# Patient Record
Sex: Female | Born: 1945
Health system: Southern US, Community
[De-identification: ages and names within clinical notes are randomized; demographics above are authoritative.]

## PROBLEM LIST (undated history)

## (undated) DIAGNOSIS — F32A Depression, unspecified: Secondary | ICD-10-CM

## (undated) DIAGNOSIS — I10 Essential (primary) hypertension: Secondary | ICD-10-CM

## (undated) DIAGNOSIS — C50919 Malignant neoplasm of unspecified site of unspecified female breast: Secondary | ICD-10-CM

## (undated) DIAGNOSIS — K219 Gastro-esophageal reflux disease without esophagitis: Secondary | ICD-10-CM

## (undated) DIAGNOSIS — D649 Anemia, unspecified: Secondary | ICD-10-CM

## (undated) DIAGNOSIS — H9191 Unspecified hearing loss, right ear: Secondary | ICD-10-CM

## (undated) DIAGNOSIS — E785 Hyperlipidemia, unspecified: Secondary | ICD-10-CM

## (undated) DIAGNOSIS — Z8719 Personal history of other diseases of the digestive system: Secondary | ICD-10-CM

## (undated) DIAGNOSIS — C801 Malignant (primary) neoplasm, unspecified: Secondary | ICD-10-CM

## (undated) DIAGNOSIS — F419 Anxiety disorder, unspecified: Secondary | ICD-10-CM

## (undated) DIAGNOSIS — N3281 Overactive bladder: Secondary | ICD-10-CM

## (undated) DIAGNOSIS — R7611 Nonspecific reaction to tuberculin skin test without active tuberculosis: Secondary | ICD-10-CM

## (undated) DIAGNOSIS — F329 Major depressive disorder, single episode, unspecified: Secondary | ICD-10-CM

## (undated) DIAGNOSIS — E049 Nontoxic goiter, unspecified: Secondary | ICD-10-CM

## (undated) HISTORY — PX: GANGLION CYST EXCISION: SHX1691

## (undated) HISTORY — PX: REDUCTION MAMMAPLASTY: SUR839

## (undated) HISTORY — PX: DILATION AND CURETTAGE OF UTERUS: SHX78

## (undated) HISTORY — DX: Malignant (primary) neoplasm, unspecified: C80.1

## (undated) HISTORY — PX: BREAST SURGERY: SHX581

## (undated) HISTORY — DX: Hyperlipidemia, unspecified: E78.5

## (undated) HISTORY — PX: OTHER SURGICAL HISTORY: SHX169

## (undated) HISTORY — DX: Anemia, unspecified: D64.9

## (undated) HISTORY — PX: AUGMENTATION MAMMAPLASTY: SUR837

---
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
## 1975-09-25 HISTORY — PX: ADRENALECTOMY: SHX876

## 1999-09-25 DIAGNOSIS — C801 Malignant (primary) neoplasm, unspecified: Secondary | ICD-10-CM

## 1999-09-25 HISTORY — DX: Malignant (primary) neoplasm, unspecified: C80.1

## 1999-09-25 HISTORY — PX: BREAST LUMPECTOMY: SHX2

## 1999-09-25 HISTORY — PX: MASTECTOMY: SHX3

## 1999-09-25 HISTORY — PX: MASTECTOMY PARTIAL / LUMPECTOMY: SUR851

## 2002-09-24 HISTORY — PX: CARDIAC CATHETERIZATION: SHX172

## 2002-09-24 HISTORY — PX: OTHER SURGICAL HISTORY: SHX169

## 2004-09-24 HISTORY — PX: ABDOMINAL HYSTERECTOMY: SHX81

## 2005-09-24 HISTORY — PX: HEMANGIOMA EXCISION: SHX1734

## 2011-08-07 ENCOUNTER — Other Ambulatory Visit: Payer: Self-pay | Admitting: Internal Medicine

## 2011-08-07 DIAGNOSIS — Z1231 Encounter for screening mammogram for malignant neoplasm of breast: Secondary | ICD-10-CM

## 2011-08-15 ENCOUNTER — Emergency Department (HOSPITAL_COMMUNITY)
Admission: EM | Admit: 2011-08-15 | Discharge: 2011-08-15 | Disposition: A | Discharge status: Home / Self Care | Payer: Medicare Other | Attending: Emergency Medicine | Admitting: Emergency Medicine

## 2011-08-15 ENCOUNTER — Encounter: Payer: Self-pay | Admitting: Emergency Medicine

## 2011-08-15 DIAGNOSIS — H5789 Other specified disorders of eye and adnexa: Secondary | ICD-10-CM | POA: Insufficient documentation

## 2011-08-15 DIAGNOSIS — I10 Essential (primary) hypertension: Secondary | ICD-10-CM | POA: Insufficient documentation

## 2011-08-15 DIAGNOSIS — K219 Gastro-esophageal reflux disease without esophagitis: Secondary | ICD-10-CM | POA: Insufficient documentation

## 2011-08-15 DIAGNOSIS — T1590XA Foreign body on external eye, part unspecified, unspecified eye, initial encounter: Secondary | ICD-10-CM | POA: Insufficient documentation

## 2011-08-15 DIAGNOSIS — H1130 Conjunctival hemorrhage, unspecified eye: Secondary | ICD-10-CM | POA: Insufficient documentation

## 2011-08-15 DIAGNOSIS — Z79899 Other long term (current) drug therapy: Secondary | ICD-10-CM | POA: Insufficient documentation

## 2011-08-15 DIAGNOSIS — S058X9A Other injuries of unspecified eye and orbit, initial encounter: Secondary | ICD-10-CM | POA: Insufficient documentation

## 2011-08-15 DIAGNOSIS — H5710 Ocular pain, unspecified eye: Secondary | ICD-10-CM | POA: Insufficient documentation

## 2011-08-15 DIAGNOSIS — E119 Type 2 diabetes mellitus without complications: Secondary | ICD-10-CM | POA: Insufficient documentation

## 2011-08-15 DIAGNOSIS — S0502XA Injury of conjunctiva and corneal abrasion without foreign body, left eye, initial encounter: Secondary | ICD-10-CM

## 2011-08-15 HISTORY — DX: Gastro-esophageal reflux disease without esophagitis: K21.9

## 2011-08-15 HISTORY — DX: Overactive bladder: N32.81

## 2011-08-15 HISTORY — DX: Essential (primary) hypertension: I10

## 2011-08-15 MED ORDER — TETRACAINE HCL 0.5 % OP SOLN
2.0000 [drp] | Freq: Once | OPHTHALMIC | Status: AC
  Administered 2011-08-15: 2 [drp] via OPHTHALMIC
  Filled 2011-08-15: qty 2

## 2011-08-15 MED ORDER — MOXIFLOXACIN HCL 0.5 % OP SOLN: 1.0000 [drp] | Freq: Three times a day (TID) | OPHTHALMIC | Status: AC

## 2011-08-15 MED ORDER — FLUORESCEIN SODIUM 1 MG OP STRP
1.0000 | ORAL_STRIP | Freq: Once | OPHTHALMIC | Status: AC
  Administered 2011-08-15: 1 via OPHTHALMIC
  Filled 2011-08-15: qty 1

## 2011-08-15 MED ORDER — MOXIFLOXACIN HCL 0.5 % OP SOLN: 1.0000 [drp] | Freq: Three times a day (TID) | OPHTHALMIC | Status: DC

## 2011-08-15 NOTE — ED Notes (Signed)
Pt states that she was up a couple of hours ago reading and she rubbed her left eye, afterwards felt like something was in her eye.  Tried to look but didn't see anything but states that anytime that she blinks something scratches her eye.

## 2011-08-15 NOTE — ED Provider Notes (Signed)
History     CSN: [id number] Arrival date & time: 08/15/2011  7:33 AM   First MD Initiated Contact with Patient 08/15/11 [phone]      Chief Complaint  Patient presents with  . Foreign Body in Eye    (Consider location/radiation/quality/duration/timing/severity/associated sxs/prior treatment) Patient is a 65 y.o. female presenting with foreign body in eye. The history is provided by the patient.  Foreign Body in Eye This is a new problem. The current episode started today. The problem occurs intermittently. The problem has been unchanged. Pertinent negatives include no abdominal pain, chest pain, coughing, fever, headaches, nausea, vomiting or weakness. Exacerbated by: touching the eye or blinking. Treatments tried: eye drops to flush the eye. Improvement on treatment: incomplete relief, still sensation of foreign body.    Past Medical History  Diagnosis Date  . Diabetes mellitus   . Hypertension   . Acid reflux   . Overactive bladder     No past surgical history on file.  No family history on file.  History  Substance Use Topics  . Smoking status: Not on file  . Smokeless tobacco: Not on file  . Alcohol Use:     OB History    Grav Para Term Preterm Abortions TAB SAB Ect Mult Living                  Review of Systems  Constitutional: Negative for fever, activity change, appetite change and unexpected weight change.  HENT: Negative.   Eyes: Positive for pain and redness. Negative for photophobia, discharge, itching and visual disturbance.       Mild eye irritation in the L eye.  Respiratory: Negative for cough, choking, chest tightness and shortness of breath.   Cardiovascular: Negative for chest pain, palpitations and leg swelling.  Gastrointestinal: Negative for nausea, vomiting, abdominal pain, diarrhea, constipation and abdominal distention.  Musculoskeletal: Negative.   Skin: Negative.   Neurological: Negative for dizziness, seizures, speech difficulty, weakness  and headaches.  Hematological: Negative.   Psychiatric/Behavioral: Negative.   All other systems reviewed and are negative.    Allergies  Amoxapine and related and Sulfa antibiotics  Home Medications   Current Outpatient Rx  Name Route Sig Dispense Refill  . ATENOLOL PO Oral Take by mouth.      Marland Kitchen VITAMIN D3 1000 UNITS PO CAPS Oral Take by mouth.      Marland Kitchen VITAMIN B-12 PO Oral Take by mouth.      Marland Kitchen HYDROCHLOROTHIAZIDE PO Oral Take by mouth.      . METFORMIN HCL PO Oral Take by mouth.      . OXYBUTYNIN CHLORIDE PO Oral Take by mouth.      Marland Kitchen PANTOPRAZOLE SODIUM PO Oral Take by mouth.      . CRESTOR PO Oral Take by mouth.      Marland Kitchen JANUVIA PO Oral Take by mouth.        BP 125/88  Pulse 78  Temp(Src) 98.2 F (36.8 C) (Oral)  Resp 16  SpO2 95%  Physical Exam  Nursing note and vitals reviewed. Constitutional: She is oriented to person, place, and time. She appears well-developed and well-nourished.  HENT:  Head: Normocephalic and atraumatic.  Eyes: EOM are normal. Pupils are equal, round, and reactive to light. Right eye exhibits no chemosis, no discharge, no exudate and no hordeolum. No foreign body present in the right eye. Left eye exhibits no chemosis, no discharge, no exudate and no hordeolum. No foreign body present in the left eye.  Right conjunctiva is not injected. Right conjunctiva has no hemorrhage. Left conjunctiva has a hemorrhage. No scleral icterus.    Neck: Normal range of motion. Neck supple.  Cardiovascular: Normal rate, regular rhythm and normal heart sounds.   Pulmonary/Chest: Effort normal and breath sounds normal. No respiratory distress. She has no wheezes. She has no rales.  Abdominal: Soft. Bowel sounds are normal. She exhibits no distension. There is no tenderness. There is no rebound.  Musculoskeletal: Normal range of motion.  Neurological: She is alert and oriented to person, place, and time. No cranial nerve deficit.  Skin: Skin is warm and dry. She is not  diaphoretic.    ED Course  Procedures (including critical care time)  Labs Reviewed - No data to display No results found.   1. Corneal abrasion, left       MDM  Pt was scratching her eye and felt like she got something in it and tried to clear it with eye drops. States every time she blinks it feels as though it is scratching. Will evert eye and look at eyelids to examine. Will examine eye to ensure that there are no scratches. Uses contact however did not have them in at the time and wearing glasses now. Per pt, no change in her visual acuity and no new floaters or spots or loss of vision.  Will use pontocaine and fluorescein to examine eye.      8:11 AM Eyes examined and R eye normal, a couple small abrasions on the L cornea. No obvious foreign body noted and no foreign body on the eyelid. Will give antibiotics since pt is diabetic and will advise to not use contacts, will give number for eye doctor in case not resolved in 5-7 days. Given moxifloxacin drops for 1 week TID to the L eye.   Genella Mech, MD 08/15/11 [phone]

## 2011-08-15 NOTE — ED Provider Notes (Signed)
I saw and evaluated the patient, reviewed the resident's note and I agree with the findings and plan.  OS foreign body sensation.  Was rubbing eye but no trauma. Contacts not in.  No change in vision.  No pain with eye movement.  Anterior chamber clear.  2 punctate abrasions on mid cornea.   Glynn Octave, MD 08/15/11 [phone]

## 2011-08-31 ENCOUNTER — Ambulatory Visit
Admission: RE | Admit: 2011-08-31 | Discharge: 2011-08-31 | Disposition: A | Discharge status: Home / Self Care | Payer: Medicare Other | Source: Ambulatory Visit | Attending: Internal Medicine | Admitting: Internal Medicine

## 2011-08-31 DIAGNOSIS — Z1231 Encounter for screening mammogram for malignant neoplasm of breast: Secondary | ICD-10-CM

## 2011-09-11 ENCOUNTER — Other Ambulatory Visit: Payer: Self-pay | Admitting: Internal Medicine

## 2011-09-11 ENCOUNTER — Other Ambulatory Visit: Payer: Self-pay | Admitting: *Deleted

## 2011-09-11 DIAGNOSIS — Z124 Encounter for screening for malignant neoplasm of cervix: Secondary | ICD-10-CM | POA: Insufficient documentation

## 2011-09-14 ENCOUNTER — Other Ambulatory Visit: Payer: Self-pay | Admitting: Internal Medicine

## 2011-09-14 DIAGNOSIS — Z1231 Encounter for screening mammogram for malignant neoplasm of breast: Secondary | ICD-10-CM

## 2011-09-14 DIAGNOSIS — Z9011 Acquired absence of right breast and nipple: Secondary | ICD-10-CM

## 2011-10-02 ENCOUNTER — Ambulatory Visit
Admission: RE | Admit: 2011-10-02 | Discharge: 2011-10-02 | Disposition: A | Discharge status: Home / Self Care | Payer: Medicare Other | Source: Ambulatory Visit | Attending: Internal Medicine | Admitting: Internal Medicine

## 2011-10-02 DIAGNOSIS — Z1231 Encounter for screening mammogram for malignant neoplasm of breast: Secondary | ICD-10-CM | POA: Diagnosis not present

## 2011-10-02 DIAGNOSIS — Z853 Personal history of malignant neoplasm of breast: Secondary | ICD-10-CM | POA: Diagnosis not present

## 2011-10-02 DIAGNOSIS — Z9011 Acquired absence of right breast and nipple: Secondary | ICD-10-CM

## 2011-11-02 ENCOUNTER — Other Ambulatory Visit (HOSPITAL_COMMUNITY)
Admission: RE | Admit: 2011-11-02 | Discharge: 2011-11-02 | Disposition: A | Discharge status: Home / Self Care | Payer: Medicare Other | Source: Ambulatory Visit | Attending: Internal Medicine | Admitting: Internal Medicine

## 2011-11-27 DIAGNOSIS — M7120 Synovial cyst of popliteal space [Baker], unspecified knee: Secondary | ICD-10-CM | POA: Diagnosis not present

## 2012-01-09 DIAGNOSIS — E785 Hyperlipidemia, unspecified: Secondary | ICD-10-CM | POA: Diagnosis not present

## 2012-01-09 DIAGNOSIS — I10 Essential (primary) hypertension: Secondary | ICD-10-CM | POA: Diagnosis not present

## 2012-01-09 DIAGNOSIS — Z1331 Encounter for screening for depression: Secondary | ICD-10-CM | POA: Diagnosis not present

## 2012-01-09 DIAGNOSIS — K219 Gastro-esophageal reflux disease without esophagitis: Secondary | ICD-10-CM | POA: Diagnosis not present

## 2012-01-09 DIAGNOSIS — G4700 Insomnia, unspecified: Secondary | ICD-10-CM | POA: Diagnosis not present

## 2012-01-09 DIAGNOSIS — IMO0001 Reserved for inherently not codable concepts without codable children: Secondary | ICD-10-CM | POA: Diagnosis not present

## 2012-03-25 DIAGNOSIS — H524 Presbyopia: Secondary | ICD-10-CM | POA: Diagnosis not present

## 2012-03-25 DIAGNOSIS — H5200 Hypermetropia, unspecified eye: Secondary | ICD-10-CM | POA: Diagnosis not present

## 2012-03-25 DIAGNOSIS — E119 Type 2 diabetes mellitus without complications: Secondary | ICD-10-CM | POA: Diagnosis not present

## 2012-04-11 DIAGNOSIS — IMO0001 Reserved for inherently not codable concepts without codable children: Secondary | ICD-10-CM | POA: Diagnosis not present

## 2012-06-21 DIAGNOSIS — Z23 Encounter for immunization: Secondary | ICD-10-CM | POA: Diagnosis not present

## 2012-08-08 ENCOUNTER — Other Ambulatory Visit: Payer: Self-pay

## 2012-08-08 DIAGNOSIS — L659 Nonscarring hair loss, unspecified: Secondary | ICD-10-CM | POA: Diagnosis not present

## 2012-08-08 DIAGNOSIS — L820 Inflamed seborrheic keratosis: Secondary | ICD-10-CM | POA: Diagnosis not present

## 2012-08-08 DIAGNOSIS — D1801 Hemangioma of skin and subcutaneous tissue: Secondary | ICD-10-CM | POA: Diagnosis not present

## 2012-08-08 DIAGNOSIS — L905 Scar conditions and fibrosis of skin: Secondary | ICD-10-CM | POA: Diagnosis not present

## 2012-08-08 DIAGNOSIS — D485 Neoplasm of uncertain behavior of skin: Secondary | ICD-10-CM | POA: Diagnosis not present

## 2012-08-08 DIAGNOSIS — L821 Other seborrheic keratosis: Secondary | ICD-10-CM | POA: Diagnosis not present

## 2012-08-08 DIAGNOSIS — L819 Disorder of pigmentation, unspecified: Secondary | ICD-10-CM | POA: Diagnosis not present

## 2012-09-03 DIAGNOSIS — E1165 Type 2 diabetes mellitus with hyperglycemia: Secondary | ICD-10-CM | POA: Diagnosis not present

## 2012-09-03 DIAGNOSIS — E559 Vitamin D deficiency, unspecified: Secondary | ICD-10-CM | POA: Diagnosis not present

## 2012-09-03 DIAGNOSIS — L659 Nonscarring hair loss, unspecified: Secondary | ICD-10-CM | POA: Diagnosis not present

## 2012-09-03 DIAGNOSIS — R809 Proteinuria, unspecified: Secondary | ICD-10-CM | POA: Diagnosis not present

## 2012-09-03 DIAGNOSIS — E1129 Type 2 diabetes mellitus with other diabetic kidney complication: Secondary | ICD-10-CM | POA: Diagnosis not present

## 2012-09-03 DIAGNOSIS — Z0000 Encounter for general adult medical examination without abnormal findings: Secondary | ICD-10-CM | POA: Diagnosis not present

## 2012-09-03 DIAGNOSIS — I10 Essential (primary) hypertension: Secondary | ICD-10-CM | POA: Diagnosis not present

## 2012-09-03 DIAGNOSIS — Z1331 Encounter for screening for depression: Secondary | ICD-10-CM | POA: Diagnosis not present

## 2012-09-03 DIAGNOSIS — K219 Gastro-esophageal reflux disease without esophagitis: Secondary | ICD-10-CM | POA: Diagnosis not present

## 2012-10-06 ENCOUNTER — Other Ambulatory Visit: Payer: Self-pay | Admitting: Internal Medicine

## 2012-10-06 DIAGNOSIS — Z853 Personal history of malignant neoplasm of breast: Secondary | ICD-10-CM

## 2012-10-06 DIAGNOSIS — Z1231 Encounter for screening mammogram for malignant neoplasm of breast: Secondary | ICD-10-CM

## 2012-10-09 ENCOUNTER — Other Ambulatory Visit: Payer: Self-pay | Admitting: Obstetrics and Gynecology

## 2012-10-09 ENCOUNTER — Other Ambulatory Visit (HOSPITAL_COMMUNITY)
Admission: RE | Admit: 2012-10-09 | Discharge: 2012-10-09 | Disposition: A | Discharge status: Home / Self Care | Payer: Medicare Other | Source: Ambulatory Visit | Attending: Obstetrics and Gynecology | Admitting: Obstetrics and Gynecology

## 2012-10-09 DIAGNOSIS — B977 Papillomavirus as the cause of diseases classified elsewhere: Secondary | ICD-10-CM | POA: Diagnosis not present

## 2012-10-09 DIAGNOSIS — Z01419 Encounter for gynecological examination (general) (routine) without abnormal findings: Secondary | ICD-10-CM | POA: Insufficient documentation

## 2012-10-09 DIAGNOSIS — N952 Postmenopausal atrophic vaginitis: Secondary | ICD-10-CM | POA: Diagnosis not present

## 2012-10-09 DIAGNOSIS — Z1151 Encounter for screening for human papillomavirus (HPV): Secondary | ICD-10-CM | POA: Diagnosis not present

## 2012-10-09 DIAGNOSIS — N63 Unspecified lump in unspecified breast: Secondary | ICD-10-CM | POA: Diagnosis not present

## 2012-10-10 ENCOUNTER — Other Ambulatory Visit: Payer: Self-pay | Admitting: Internal Medicine

## 2012-10-10 DIAGNOSIS — N6320 Unspecified lump in the left breast, unspecified quadrant: Secondary | ICD-10-CM

## 2012-10-20 ENCOUNTER — Ambulatory Visit
Admission: RE | Admit: 2012-10-20 | Discharge: 2012-10-20 | Disposition: A | Discharge status: Home / Self Care | Payer: Medicare Other | Source: Ambulatory Visit | Attending: Internal Medicine | Admitting: Internal Medicine

## 2012-10-20 DIAGNOSIS — N6320 Unspecified lump in the left breast, unspecified quadrant: Secondary | ICD-10-CM

## 2012-10-20 DIAGNOSIS — Z853 Personal history of malignant neoplasm of breast: Secondary | ICD-10-CM | POA: Diagnosis not present

## 2012-10-29 ENCOUNTER — Ambulatory Visit: Payer: Medicare Other

## 2012-11-19 DIAGNOSIS — IMO0001 Reserved for inherently not codable concepts without codable children: Secondary | ICD-10-CM | POA: Diagnosis not present

## 2012-12-12 DIAGNOSIS — K219 Gastro-esophageal reflux disease without esophagitis: Secondary | ICD-10-CM | POA: Diagnosis not present

## 2012-12-12 DIAGNOSIS — E119 Type 2 diabetes mellitus without complications: Secondary | ICD-10-CM | POA: Diagnosis not present

## 2012-12-12 DIAGNOSIS — D509 Iron deficiency anemia, unspecified: Secondary | ICD-10-CM | POA: Diagnosis not present

## 2012-12-12 DIAGNOSIS — I10 Essential (primary) hypertension: Secondary | ICD-10-CM | POA: Diagnosis not present

## 2012-12-24 DIAGNOSIS — IMO0001 Reserved for inherently not codable concepts without codable children: Secondary | ICD-10-CM | POA: Diagnosis not present

## 2013-01-28 DIAGNOSIS — E1129 Type 2 diabetes mellitus with other diabetic kidney complication: Secondary | ICD-10-CM | POA: Diagnosis not present

## 2013-02-12 DIAGNOSIS — L820 Inflamed seborrheic keratosis: Secondary | ICD-10-CM | POA: Diagnosis not present

## 2013-02-12 DIAGNOSIS — L821 Other seborrheic keratosis: Secondary | ICD-10-CM | POA: Diagnosis not present

## 2013-03-19 DIAGNOSIS — IMO0001 Reserved for inherently not codable concepts without codable children: Secondary | ICD-10-CM | POA: Diagnosis not present

## 2013-03-19 DIAGNOSIS — I10 Essential (primary) hypertension: Secondary | ICD-10-CM | POA: Diagnosis not present

## 2013-03-30 DIAGNOSIS — E119 Type 2 diabetes mellitus without complications: Secondary | ICD-10-CM | POA: Diagnosis not present

## 2013-04-13 ENCOUNTER — Other Ambulatory Visit: Payer: Self-pay | Admitting: Internal Medicine

## 2013-04-13 DIAGNOSIS — R221 Localized swelling, mass and lump, neck: Secondary | ICD-10-CM

## 2013-04-13 DIAGNOSIS — R220 Localized swelling, mass and lump, head: Secondary | ICD-10-CM | POA: Diagnosis not present

## 2013-04-14 ENCOUNTER — Other Ambulatory Visit: Payer: Self-pay | Admitting: Internal Medicine

## 2013-04-14 ENCOUNTER — Ambulatory Visit
Admission: RE | Admit: 2013-04-14 | Discharge: 2013-04-14 | Disposition: A | Discharge status: Home / Self Care | Payer: Medicare Other | Source: Ambulatory Visit | Attending: Internal Medicine | Admitting: Internal Medicine

## 2013-04-14 DIAGNOSIS — R599 Enlarged lymph nodes, unspecified: Secondary | ICD-10-CM | POA: Diagnosis not present

## 2013-04-14 DIAGNOSIS — E041 Nontoxic single thyroid nodule: Secondary | ICD-10-CM

## 2013-04-14 DIAGNOSIS — R221 Localized swelling, mass and lump, neck: Secondary | ICD-10-CM

## 2013-04-14 DIAGNOSIS — R9389 Abnormal findings on diagnostic imaging of other specified body structures: Secondary | ICD-10-CM

## 2013-04-14 MED ORDER — IOHEXOL 300 MG/ML  SOLN
75.0000 mL | Freq: Once | INTRAMUSCULAR | Status: AC | PRN
  Administered 2013-04-14: 75 mL via INTRAVENOUS

## 2013-04-16 ENCOUNTER — Ambulatory Visit
Admission: RE | Admit: 2013-04-16 | Discharge: 2013-04-16 | Disposition: A | Discharge status: Home / Self Care | Payer: Medicare Other | Source: Ambulatory Visit | Attending: Internal Medicine | Admitting: Internal Medicine

## 2013-04-16 ENCOUNTER — Other Ambulatory Visit (HOSPITAL_COMMUNITY)
Admission: RE | Admit: 2013-04-16 | Discharge: 2013-04-16 | Disposition: A | Discharge status: Home / Self Care | Payer: Medicare Other | Source: Ambulatory Visit | Attending: Internal Medicine | Admitting: Internal Medicine

## 2013-04-16 DIAGNOSIS — E042 Nontoxic multinodular goiter: Secondary | ICD-10-CM | POA: Diagnosis not present

## 2013-04-16 DIAGNOSIS — E041 Nontoxic single thyroid nodule: Secondary | ICD-10-CM | POA: Diagnosis not present

## 2013-04-16 DIAGNOSIS — E049 Nontoxic goiter, unspecified: Secondary | ICD-10-CM | POA: Insufficient documentation

## 2013-04-16 DIAGNOSIS — R9389 Abnormal findings on diagnostic imaging of other specified body structures: Secondary | ICD-10-CM

## 2013-04-28 DIAGNOSIS — D1801 Hemangioma of skin and subcutaneous tissue: Secondary | ICD-10-CM | POA: Diagnosis not present

## 2013-04-28 DIAGNOSIS — D2330 Other benign neoplasm of skin of unspecified part of face: Secondary | ICD-10-CM | POA: Diagnosis not present

## 2013-04-28 DIAGNOSIS — L659 Nonscarring hair loss, unspecified: Secondary | ICD-10-CM | POA: Diagnosis not present

## 2013-04-28 DIAGNOSIS — L739 Follicular disorder, unspecified: Secondary | ICD-10-CM | POA: Diagnosis not present

## 2013-04-28 DIAGNOSIS — L819 Disorder of pigmentation, unspecified: Secondary | ICD-10-CM | POA: Diagnosis not present

## 2013-04-28 DIAGNOSIS — L820 Inflamed seborrheic keratosis: Secondary | ICD-10-CM | POA: Diagnosis not present

## 2013-04-28 DIAGNOSIS — L821 Other seborrheic keratosis: Secondary | ICD-10-CM | POA: Diagnosis not present

## 2013-05-18 ENCOUNTER — Encounter (INDEPENDENT_AMBULATORY_CARE_PROVIDER_SITE_OTHER): Payer: Self-pay | Admitting: Surgery

## 2013-05-18 ENCOUNTER — Ambulatory Visit (INDEPENDENT_AMBULATORY_CARE_PROVIDER_SITE_OTHER): Payer: Medicare Other | Admitting: Surgery

## 2013-05-18 VITALS — BP 148/82 | HR 74 | Resp 18 | Ht 64.5 in | Wt 196.6 lb

## 2013-05-18 DIAGNOSIS — Q892 Congenital malformations of other endocrine glands: Secondary | ICD-10-CM

## 2013-05-18 DIAGNOSIS — E042 Nontoxic multinodular goiter: Secondary | ICD-10-CM | POA: Diagnosis not present

## 2013-05-18 NOTE — Progress Notes (Signed)
General Surgery [facility]  Chief Complaint  Patient presents with  . New Evaluation    thyroid nodules, thyroglossal duct cyst - referral from Dr. Kirby Funk    HISTORY: Patient is a 67 year old female referred by her primary care physician for evaluation of a neck mass. Dr. Kirby Funk has already performed a thorough workup including CT scan of the neck, thyroid ultrasound, and ultrasound-guided fine-needle aspiration biopsy of a dominant thyroid nodule. After review of these studies, it appears that the patient has 2 separate problems, being a thyroglossal duct cyst and a multinodular thyroid goiter.  Patient first noted a mass in the anterior neck approximately 8 months ago. This was brought to her attention by her dentist. Patient has noted mild increase in size of the centrally located neck mass. She brought this to the attention of her primary care physician who ordered the above studies.  CT scan shows a probable thyroglossal duct cyst measuring approximately 2 cm in greatest dimension. It appears multi-septated. It is slightly thick walled consistent with inflammatory changes.  Thyroid ultrasound was performed and shows a slightly enlarged thyroid containing multiple bilateral thyroid nodules. The dominant nodule is in the left inferior pole measuring 2.9 cm. Ultrasound-guided fine-needle aspiration biopsy was performed and shows benign follicular cells consistent with a follicular adenoma.  Patient was on Synthroid for a brief time in her early 43s for irregular menstrual cycles. Patient has also undergone a right adrenalectomy for a benign tumor. There is a family history of thyroid disease with nodules in the patient's daughter. There is no family history of thyroid malignancy.  Past Medical History  Diagnosis Date  . Diabetes mellitus   . Hypertension   . Acid reflux   . Overactive bladder   . Anemia   . Hyperlipidemia   . Cancer     Current  Outpatient Prescriptions  Medication Sig Dispense Refill  . atenolol (TENORMIN) 25 MG tablet Take 25 mg by mouth daily.        . calcium-vitamin D (OSCAL WITH D) 500-200 MG-UNIT per tablet Take 1 tablet by mouth.      . Cyanocobalamin (VITAMIN B-12 PO) Take 1 tablet by mouth daily. otc      . glimepiride (AMARYL) 4 MG tablet       . hydrochlorothiazide (HYDRODIURIL) 25 MG tablet Take 25 mg by mouth daily.        Marland Kitchen ibuprofen (ADVIL,MOTRIN) 200 MG tablet Take 200 mg by mouth every 6 (six) hours as needed for pain.      . metFORMIN (GLUCOPHAGE) 500 MG tablet Take 1,000 mg by mouth 2 (two) times daily with a meal.        . Multiple Vitamins-Minerals (MULTIVITAMIN WITH MINERALS) tablet Take 1 tablet by mouth daily.      Marland Kitchen oxybutynin (DITROPAN XL) 15 MG 24 hr tablet Take 15 mg by mouth daily.        . pantoprazole (PROTONIX) 40 MG tablet       . potassium chloride (MICRO-K) 10 MEQ CR capsule       . rosuvastatin (CRESTOR) 10 MG tablet Take 10 mg by mouth daily.        Marland Kitchen VICTOZA 18 MG/3ML SOPN        No current facility-administered medications for this visit.    Allergies  Allergen Reactions  . Morphine And Related Shortness Of Breath  . Amoxapine And Related     amoxicillin  . Demerol [Meperidine] Nausea Only  .  Sulfa Antibiotics     Family History  Problem Relation Age of Onset  . Cancer Maternal Grandmother     breast    History   Social History  . Marital Status: Divorced    Spouse Name: N/A    Number of Children: N/A  . Years of Education: N/A   Social History Main Topics  . Smoking status: Never Smoker   . Smokeless tobacco: Never Used  . Alcohol Use: Yes  . Drug Use: No  . Sexual Activity: None   Other Topics Concern  . None   Social History Narrative  . None    REVIEW OF SYSTEMS - PERTINENT POSITIVES ONLY: Denies tremor. Denies palpitation. Denies compressive symptoms. Mild discomfort at site of thyroglossal duct cyst.  EXAM: Filed Vitals:   05/18/13  1105  BP: 148/82  Pulse: 74  Resp: 18    HEENT: normocephalic; pupils equal and reactive; sclerae clear; dentition good; mucous membranes moist NECK:  Palpable nodule in upper midline of the neck, approximately 2-2-1/2 cm in size, mobile, mildly tender; thyroid gland is mildly nodular and slightly firm in the left thyroid lobe, palpable nodule in inferior left lobe extending beneath the head of the clavicle; symmetric on extension; no palpable anterior or posterior cervical lymphadenopathy; no supraclavicular masses; no tenderness CHEST: clear to auscultation bilaterally without rales, rhonchi, or wheezes CARDIAC: regular rate and rhythm without significant murmur; peripheral pulses are full EXT:  non-tender without edema; no deformity NEURO: no gross focal deficits; no sign of tremor   LABORATORY RESULTS: See [facility] (CHL-Epic) for most recent results  RADIOLOGY RESULTS: See [facility] (CHL-Epic) for most recent results  IMPRESSION: #1 multinodular thyroid goiter, dominant left thyroid nodule, 2.9 cm, with benign cytopathology #2 probable thyroglossal duct cyst  PLAN: The patient and I reviewed the above studies at length. I have recommended continued close observation for her multinodular goiter. At this time there is no absolute indication for surgery. I have asked to see her back for physical examination in 6 months. We will likely obtain a thyroid ultrasound for comparison to her study in July 2014. We will monitor her TSH level.  Patient has a thyroglossal duct cyst in the upper midline of the neck. I am going to ask her to see Dr. Osborn Coho at [facility] in consultation.  Velora Heckler, MD, FACS General & Endocrine Surgery [facility]  Primary Care Physician: Lillia Mountain, MD

## 2013-05-18 NOTE — Patient Instructions (Signed)
Thyroglossal Cyst A thyroglossal cyst is an abnormal fluid-filled sac in the upper part of the neck. It forms before birth (congenital), during the development of the thyroid gland. This gland begins as a small group of cells at the very back of the base of your tongue. As these cells grow to form your thyroid gland, they begin to move down your neck through a canal called the thyroglossal duct. The thyroid gland moves down the thyroglossal duct until it arrives in its final place low in the neck, just above your breast bone. The thyroglossal duct normally disappears. However, sometimes it does not close completely and leaves an open space that may fill up with fluid or a thick mucus-like material, creating a thyroglossal cyst.  Thyroglossal cysts usually are discovered in patients who are between the ages of 2 to 15 years old. They are more prevalent in males. Small cysts often are not detected. It is common for thyroglossal cysts to become infected. SYMPTOMS Symptoms of thyroglossal cysts may include:  A round lump in the front and middle part of the neck. The lump may be soft or hard and, if infected, painful and red. The lump will move up and down when you swallow or stick out your tongue.  Difficulty swallowing or breathing (very large cysts).  Mucus may seep from a small opening in the skin (fistula) near the lump. DIAGNOSIS To diagnose a thyroglossal cyst, your caregiver may perform the following exams:  Physical exam. Your care giver may feel your throat and ask you to swallow and stick out your tongue.  Imaging exams. These can include the following tests:  A computerized X-ray scan (CT scan) to examine the cyst and surrounding tissue.  An exam that uses sound waves to create a picture of the cyst (ultrasonography). TREATMENT Treatment options for thyroglossal cyst include:  Antibiotic medicine to treat a bacterial infection of a thyroglossal cyst. This also may shrink the size of  the cyst.  Surgery to remove thyroglossal cyst. Surgery may be used as treatment when you have:  Recurrent infections.  Breathing or swallowing difficulties because of the size of the cyst.  Cosmetic reasons for doing so. Document Released: 01/05/2011 Document Revised: 12/03/2011 Document Reviewed: 01/05/2011 Exit[employer] Patient Information 2014 [employer].  

## 2013-05-20 DIAGNOSIS — Q892 Congenital malformations of other endocrine glands: Secondary | ICD-10-CM | POA: Diagnosis not present

## 2013-06-02 ENCOUNTER — Encounter (INDEPENDENT_AMBULATORY_CARE_PROVIDER_SITE_OTHER): Payer: Self-pay

## 2013-06-03 DIAGNOSIS — L02419 Cutaneous abscess of limb, unspecified: Secondary | ICD-10-CM | POA: Diagnosis not present

## 2013-06-03 DIAGNOSIS — L03119 Cellulitis of unspecified part of limb: Secondary | ICD-10-CM | POA: Diagnosis not present

## 2013-06-03 DIAGNOSIS — E876 Hypokalemia: Secondary | ICD-10-CM | POA: Diagnosis not present

## 2013-06-03 DIAGNOSIS — D509 Iron deficiency anemia, unspecified: Secondary | ICD-10-CM | POA: Diagnosis not present

## 2013-06-03 DIAGNOSIS — I10 Essential (primary) hypertension: Secondary | ICD-10-CM | POA: Diagnosis not present

## 2013-06-03 DIAGNOSIS — K219 Gastro-esophageal reflux disease without esophagitis: Secondary | ICD-10-CM | POA: Diagnosis not present

## 2013-06-03 DIAGNOSIS — Q892 Congenital malformations of other endocrine glands: Secondary | ICD-10-CM | POA: Diagnosis not present

## 2013-06-03 DIAGNOSIS — IMO0001 Reserved for inherently not codable concepts without codable children: Secondary | ICD-10-CM | POA: Diagnosis not present

## 2013-06-18 DIAGNOSIS — IMO0001 Reserved for inherently not codable concepts without codable children: Secondary | ICD-10-CM | POA: Diagnosis not present

## 2013-07-03 DIAGNOSIS — Z23 Encounter for immunization: Secondary | ICD-10-CM | POA: Diagnosis not present

## 2013-07-20 DIAGNOSIS — M722 Plantar fascial fibromatosis: Secondary | ICD-10-CM | POA: Diagnosis not present

## 2013-08-11 DIAGNOSIS — Q892 Congenital malformations of other endocrine glands: Secondary | ICD-10-CM | POA: Diagnosis not present

## 2013-08-13 ENCOUNTER — Encounter (HOSPITAL_COMMUNITY): Payer: Self-pay | Admitting: Pharmacy Technician

## 2013-08-13 NOTE — Pre-Procedure Instructions (Addendum)
WILLY VORCE  08/13/2013   Your procedure is scheduled on:  Wednesday, Dec. 3rd   Report to [facility]  2 * 3 at  6:30 AM.             [facility] Parking)  Call this number if you have problems the morning of surgery: [phone]   Remember:   Do not eat food or drink liquids after midnight Tuesday.   Take these medicines the morning of surgery with A SIP OF WATER: Atenolol, Protonix             (Do NOT take any diabetic medications the morning to surgery.)   Do not wear jewelry, make-up or nail polish.  Do not wear lotions, powders, or perfumes. You may wear deodorant.  Do not shave underarms & legs 48 hours prior to surgery.   Do not bring valuables to the hospital.  [facility] is not responsible for any belongings or valuables.               Contacts, dentures or bridgework may not be worn into surgery.  Leave suitcase in the car. After surgery it may be brought to your room.  For patients admitted to the hospital, discharge time is determined by your treatment team.               Patients discharged the day of surgery will not be allowed to drive home.   Name and phone number of your driver:    Special Instructions: Shower using CHG 2 nights before surgery and the night before surgery.  If you shower the day of surgery use CHG.  Use special wash - you have one bottle of CHG for all showers.  You should use approximately 1/3 of the bottle for each shower.   Please read over the following fact sheets that you were given: Pain Booklet and Surgical Site Infection Prevention

## 2013-08-14 ENCOUNTER — Encounter (HOSPITAL_COMMUNITY): Payer: Self-pay

## 2013-08-14 ENCOUNTER — Encounter (HOSPITAL_COMMUNITY)
Admission: RE | Admit: 2013-08-14 | Discharge: 2013-08-14 | Disposition: A | Discharge status: Home / Self Care | Payer: Medicare Other | Source: Ambulatory Visit | Attending: Anesthesiology | Admitting: Anesthesiology

## 2013-08-14 ENCOUNTER — Other Ambulatory Visit: Payer: Self-pay | Admitting: Otolaryngology

## 2013-08-14 ENCOUNTER — Encounter (HOSPITAL_COMMUNITY)
Admission: RE | Admit: 2013-08-14 | Discharge: 2013-08-14 | Disposition: A | Discharge status: Home / Self Care | Payer: Medicare Other | Source: Ambulatory Visit | Attending: Otolaryngology | Admitting: Otolaryngology

## 2013-08-14 DIAGNOSIS — Z01810 Encounter for preprocedural cardiovascular examination: Secondary | ICD-10-CM | POA: Diagnosis not present

## 2013-08-14 DIAGNOSIS — Z01818 Encounter for other preprocedural examination: Secondary | ICD-10-CM | POA: Diagnosis not present

## 2013-08-14 HISTORY — DX: Nonspecific reaction to tuberculin skin test without active tuberculosis: R76.11

## 2013-08-14 HISTORY — DX: Major depressive disorder, single episode, unspecified: F32.9

## 2013-08-14 HISTORY — DX: Personal history of other diseases of the digestive system: Z87.19

## 2013-08-14 HISTORY — DX: Depression, unspecified: F32.A

## 2013-08-14 HISTORY — DX: Nontoxic goiter, unspecified: E04.9

## 2013-08-14 HISTORY — DX: Anxiety disorder, unspecified: F41.9

## 2013-08-14 LAB — CBC
HCT: 43.7 % (ref 36.0–46.0)
Hemoglobin: 14.5 g/dL (ref 12.0–15.0)
MCH: 29.7 pg (ref 26.0–34.0)
MCHC: 33.2 g/dL (ref 30.0–36.0)
MCV: 89.5 fL (ref 78.0–100.0)
Platelets: 251 10*3/uL (ref 150–400)
RBC: 4.88 MIL/uL (ref 3.87–5.11)
RDW: 13.2 % (ref 11.5–15.5)
WBC: 6.7 10*3/uL (ref 4.0–10.5)

## 2013-08-14 LAB — BASIC METABOLIC PANEL
BUN: 16 mg/dL (ref 6–23)
CO2: 24 mEq/L (ref 19–32)
Calcium: 10.1 mg/dL (ref 8.4–10.5)
Chloride: 100 mEq/L (ref 96–112)
Creatinine, Ser: 0.85 mg/dL (ref 0.50–1.10)
GFR calc Af Amer: 80 mL/min — ABNORMAL LOW (ref >90)
GFR calc non Af Amer: 69 mL/min — ABNORMAL LOW (ref >90)
Glucose, Bld: 181 mg/dL — ABNORMAL HIGH (ref 70–99)
Potassium: 4.6 mEq/L (ref 3.5–5.1)
Sodium: 138 mEq/L (ref 135–145)

## 2013-08-17 NOTE — Progress Notes (Addendum)
Anesthesia Chart Review:  Patient is a 67 year old female scheduled for excisional thyroglossal duct cyst on 08/26/13 by Dr. Annalee Genta.  History includes obesity, non-smoker, anemia, HTN, HLD, hiatal hernia, goiter, depression, anxiety, overactive bladder, positive PPD, hysterectomy, breast cancer s/p right mastectomy '01 with history of reconstruction, left nares hemanigoma excision, right adrenalectomy '77, cardiac cath 10 years ago with no reported CAD history. PCP is Dr. Kirby Funk.  She had a previous cardiology evaluation > 3 years ago (Dr. Kayren Eaves in June 2010) at [facility] in [place] following a diagnosis of DM.  She was asymptomatic at that time.  EKG on 08/14/13 showed NSR, low voltage QRS, poor r wave progression.  I think her EKG appears stable when compared to previous EKG on 03/10/09.  No CV symptoms were documented from her PAT visit.  Preoperative CXR and labs noted.  Patient will be evaluated by her assigned anesthesiologist on the day of surgery.  If no acute changes then I would anticipate that she could proceed as planned.  Velna Ochs [facility]/Anesthesiology Phone [phone] 08/17/2013 9:54 AM

## 2013-08-19 DIAGNOSIS — I10 Essential (primary) hypertension: Secondary | ICD-10-CM | POA: Diagnosis not present

## 2013-08-19 DIAGNOSIS — K219 Gastro-esophageal reflux disease without esophagitis: Secondary | ICD-10-CM | POA: Diagnosis not present

## 2013-08-19 DIAGNOSIS — Z79899 Other long term (current) drug therapy: Secondary | ICD-10-CM | POA: Diagnosis not present

## 2013-08-19 DIAGNOSIS — IMO0001 Reserved for inherently not codable concepts without codable children: Secondary | ICD-10-CM | POA: Diagnosis not present

## 2013-08-26 ENCOUNTER — Encounter (HOSPITAL_COMMUNITY)
Admission: RE | Disposition: A | Discharge status: Home / Self Care | Payer: Self-pay | Source: Ambulatory Visit | Attending: Otolaryngology

## 2013-08-26 ENCOUNTER — Encounter (HOSPITAL_COMMUNITY): Payer: Medicare Other | Admitting: Vascular Surgery

## 2013-08-26 ENCOUNTER — Ambulatory Visit (HOSPITAL_COMMUNITY)
Admission: RE | Admit: 2013-08-26 | Discharge: 2013-08-27 | Disposition: A | Discharge status: Home / Self Care | Payer: Medicare Other | Source: Ambulatory Visit | Attending: Otolaryngology | Admitting: Otolaryngology

## 2013-08-26 ENCOUNTER — Ambulatory Visit (HOSPITAL_COMMUNITY): Payer: Medicare Other | Admitting: Anesthesiology

## 2013-08-26 ENCOUNTER — Encounter (HOSPITAL_COMMUNITY): Payer: Self-pay | Admitting: *Deleted

## 2013-08-26 DIAGNOSIS — F3289 Other specified depressive episodes: Secondary | ICD-10-CM | POA: Insufficient documentation

## 2013-08-26 DIAGNOSIS — H9191 Unspecified hearing loss, right ear: Secondary | ICD-10-CM

## 2013-08-26 DIAGNOSIS — K219 Gastro-esophageal reflux disease without esophagitis: Secondary | ICD-10-CM | POA: Diagnosis not present

## 2013-08-26 DIAGNOSIS — I10 Essential (primary) hypertension: Secondary | ICD-10-CM | POA: Insufficient documentation

## 2013-08-26 DIAGNOSIS — F411 Generalized anxiety disorder: Secondary | ICD-10-CM | POA: Insufficient documentation

## 2013-08-26 DIAGNOSIS — E785 Hyperlipidemia, unspecified: Secondary | ICD-10-CM | POA: Diagnosis not present

## 2013-08-26 DIAGNOSIS — Q892 Congenital malformations of other endocrine glands: Secondary | ICD-10-CM

## 2013-08-26 DIAGNOSIS — D649 Anemia, unspecified: Secondary | ICD-10-CM | POA: Insufficient documentation

## 2013-08-26 DIAGNOSIS — F329 Major depressive disorder, single episode, unspecified: Secondary | ICD-10-CM | POA: Diagnosis not present

## 2013-08-26 DIAGNOSIS — Z853 Personal history of malignant neoplasm of breast: Secondary | ICD-10-CM | POA: Insufficient documentation

## 2013-08-26 DIAGNOSIS — N318 Other neuromuscular dysfunction of bladder: Secondary | ICD-10-CM | POA: Insufficient documentation

## 2013-08-26 DIAGNOSIS — E041 Nontoxic single thyroid nodule: Secondary | ICD-10-CM | POA: Insufficient documentation

## 2013-08-26 DIAGNOSIS — E119 Type 2 diabetes mellitus without complications: Secondary | ICD-10-CM | POA: Diagnosis not present

## 2013-08-26 HISTORY — PX: THYROGLOSSAL DUCT CYST: SHX297

## 2013-08-26 HISTORY — PX: THYROID CYST EXCISION: SHX2511

## 2013-08-26 HISTORY — DX: Unspecified hearing loss, right ear: H91.91

## 2013-08-26 LAB — GLUCOSE, CAPILLARY
Glucose-Capillary: 165 mg/dL — ABNORMAL HIGH (ref 70–99)
Glucose-Capillary: 130 mg/dL — ABNORMAL HIGH (ref 70–99)
Glucose-Capillary: 150 mg/dL — ABNORMAL HIGH (ref 70–99)
Glucose-Capillary: 182 mg/dL — ABNORMAL HIGH (ref 70–99)
Glucose-Capillary: 201 mg/dL — ABNORMAL HIGH (ref 70–99)

## 2013-08-26 LAB — CBC
HCT: 39.2 % (ref 36.0–46.0)
Hemoglobin: 13 g/dL (ref 12.0–15.0)
MCH: 30.2 pg (ref 26.0–34.0)
MCHC: 33.2 g/dL (ref 30.0–36.0)
MCV: 91 fL (ref 78.0–100.0)
Platelets: 188 10*3/uL (ref 150–400)
RBC: 4.31 MIL/uL (ref 3.87–5.11)
RDW: 13.1 % (ref 11.5–15.5)
WBC: 7 10*3/uL (ref 4.0–10.5)

## 2013-08-26 LAB — CREATININE, SERUM
Creatinine, Ser: 0.81 mg/dL (ref 0.50–1.10)
GFR calc Af Amer: 85 mL/min — ABNORMAL LOW (ref >90)
GFR calc non Af Amer: 73 mL/min — ABNORMAL LOW (ref >90)

## 2013-08-26 SURGERY — EXCISION, THYROGLOSSAL DUCT CYST
Anesthesia: General | Site: Neck

## 2013-08-26 MED ORDER — CIPROFLOXACIN IN D5W 400 MG/200ML IV SOLN
400.0000 mg | Freq: Once | INTRAVENOUS | Status: DC
  Filled 2013-08-26: qty 200

## 2013-08-26 MED ORDER — ACETAMINOPHEN 650 MG RE SUPP: 650.0000 mg | RECTAL | Status: DC | PRN

## 2013-08-26 MED ORDER — LIDOCAINE-EPINEPHRINE 1 %-1:100000 IJ SOLN
INTRAMUSCULAR | Status: AC
  Filled 2013-08-26: qty 1

## 2013-08-26 MED ORDER — LIDOCAINE-EPINEPHRINE 1 %-1:100000 IJ SOLN
INTRAMUSCULAR | Status: DC | PRN
  Administered 2013-08-26: 3 mL

## 2013-08-26 MED ORDER — ATENOLOL 25 MG PO TABS
25.0000 mg | ORAL_TABLET | Freq: Two times a day (BID) | ORAL | Status: DC
  Administered 2013-08-26: 25 mg via ORAL
  Filled 2013-08-26 (×4): qty 1

## 2013-08-26 MED ORDER — HYDROCODONE-ACETAMINOPHEN 7.5-325 MG/15ML PO SOLN
ORAL | Status: AC
  Filled 2013-08-26: qty 15

## 2013-08-26 MED ORDER — ONDANSETRON HCL 4 MG/2ML IJ SOLN
INTRAMUSCULAR | Status: DC | PRN
  Administered 2013-08-26: 4 mg via INTRAVENOUS

## 2013-08-26 MED ORDER — ARTIFICIAL TEARS OP OINT
TOPICAL_OINTMENT | OPHTHALMIC | Status: DC | PRN
  Administered 2013-08-26: 1 via OPHTHALMIC

## 2013-08-26 MED ORDER — LACTATED RINGERS IV SOLN
INTRAVENOUS | Status: DC | PRN
  Administered 2013-08-26 (×2): via INTRAVENOUS

## 2013-08-26 MED ORDER — MIDAZOLAM HCL 2 MG/2ML IJ SOLN: 0.5000 mg | Freq: Once | INTRAMUSCULAR | Status: DC | PRN

## 2013-08-26 MED ORDER — OXYCODONE HCL 5 MG/5ML PO SOLN
5.0000 mg | Freq: Once | ORAL | Status: AC | PRN
  Administered 2013-08-26: 5 mg via ORAL

## 2013-08-26 MED ORDER — ONDANSETRON HCL 4 MG/2ML IJ SOLN: 4.0000 mg | INTRAMUSCULAR | Status: DC | PRN

## 2013-08-26 MED ORDER — OXYCODONE HCL 5 MG/5ML PO SOLN
ORAL | Status: AC
  Filled 2013-08-26: qty 5

## 2013-08-26 MED ORDER — SUCCINYLCHOLINE CHLORIDE 20 MG/ML IJ SOLN
INTRAMUSCULAR | Status: DC | PRN
  Administered 2013-08-26: 100 mg via INTRAVENOUS

## 2013-08-26 MED ORDER — NEOSTIGMINE METHYLSULFATE 1 MG/ML IJ SOLN
INTRAMUSCULAR | Status: DC | PRN
  Administered 2013-08-26: 3 mg via INTRAVENOUS

## 2013-08-26 MED ORDER — CIPROFLOXACIN IN D5W 400 MG/200ML IV SOLN
INTRAVENOUS | Status: AC
  Administered 2013-08-26: 400 mg via INTRAVENOUS
  Filled 2013-08-26: qty 200

## 2013-08-26 MED ORDER — PROPOFOL 10 MG/ML IV BOLUS
INTRAVENOUS | Status: DC | PRN
  Administered 2013-08-26: 60 mg via INTRAVENOUS
  Administered 2013-08-26: 140 mg via INTRAVENOUS

## 2013-08-26 MED ORDER — OXYCODONE HCL 5 MG PO TABS
5.0000 mg | ORAL_TABLET | Freq: Once | ORAL | Status: AC | PRN
Start: 2013-08-26 — End: 2013-08-26

## 2013-08-26 MED ORDER — AMLODIPINE BESYLATE 5 MG PO TABS
5.0000 mg | ORAL_TABLET | Freq: Every day | ORAL | Status: DC
Start: 2013-08-26 — End: 2013-08-27
  Administered 2013-08-26: 5 mg via ORAL
  Filled 2013-08-26 (×3): qty 1

## 2013-08-26 MED ORDER — PANTOPRAZOLE SODIUM 40 MG PO TBEC
40.0000 mg | DELAYED_RELEASE_TABLET | Freq: Every day | ORAL | Status: DC
  Administered 2013-08-27: 40 mg via ORAL
  Filled 2013-08-26: qty 1

## 2013-08-26 MED ORDER — ONDANSETRON HCL 4 MG PO TABS: 4.0000 mg | ORAL_TABLET | ORAL | Status: DC | PRN

## 2013-08-26 MED ORDER — VITAMIN D3 25 MCG (1000 UNIT) PO TABS
2000.0000 [IU] | ORAL_TABLET | Freq: Every day | ORAL | Status: DC
  Administered 2013-08-26 – 2013-08-27 (×2): 2000 [IU] via ORAL
  Filled 2013-08-26 (×2): qty 2

## 2013-08-26 MED ORDER — HEPARIN SODIUM (PORCINE) 5000 UNIT/ML IJ SOLN
5000.0000 [IU] | Freq: Three times a day (TID) | INTRAMUSCULAR | Status: DC
  Administered 2013-08-26 – 2013-08-27 (×3): 5000 [IU] via SUBCUTANEOUS
  Filled 2013-08-26 (×5): qty 1

## 2013-08-26 MED ORDER — ACETAMINOPHEN 160 MG/5ML PO SOLN: 650.0000 mg | ORAL | Status: DC | PRN

## 2013-08-26 MED ORDER — FERROUS SULFATE 325 (65 FE) MG PO TABS
325.0000 mg | ORAL_TABLET | Freq: Every day | ORAL | Status: DC
  Administered 2013-08-27: 325 mg via ORAL
  Filled 2013-08-26 (×2): qty 1

## 2013-08-26 MED ORDER — FENTANYL CITRATE 0.05 MG/ML IJ SOLN
INTRAMUSCULAR | Status: AC
  Administered 2013-08-26: 50 ug via INTRAVENOUS
  Filled 2013-08-26: qty 2

## 2013-08-26 MED ORDER — INSULIN GLARGINE 100 UNIT/ML ~~LOC~~ SOLN
20.0000 [IU] | Freq: Every day | SUBCUTANEOUS | Status: DC
  Administered 2013-08-26: 20 [IU] via SUBCUTANEOUS
  Filled 2013-08-26 (×2): qty 0.2

## 2013-08-26 MED ORDER — POTASSIUM CHLORIDE CRYS ER 10 MEQ PO TBCR
10.0000 meq | EXTENDED_RELEASE_TABLET | Freq: Once | ORAL | Status: AC
  Administered 2013-08-26: 10 meq via ORAL
  Filled 2013-08-26: qty 1

## 2013-08-26 MED ORDER — MIDAZOLAM HCL 5 MG/5ML IJ SOLN
INTRAMUSCULAR | Status: DC | PRN
  Administered 2013-08-26 (×2): 1 mg via INTRAVENOUS

## 2013-08-26 MED ORDER — HYDROCODONE-ACETAMINOPHEN 7.5-325 MG/15ML PO SOLN
10.0000 mL | ORAL | Status: DC | PRN
  Administered 2013-08-26 – 2013-08-27 (×5): 15 mL via ORAL
  Filled 2013-08-26 (×4): qty 15

## 2013-08-26 MED ORDER — FENTANYL CITRATE 0.05 MG/ML IJ SOLN
25.0000 ug | INTRAMUSCULAR | Status: DC | PRN
  Administered 2013-08-26 (×2): 50 ug via INTRAVENOUS

## 2013-08-26 MED ORDER — VITAMIN B-12 1000 MCG PO TABS
1000.0000 ug | ORAL_TABLET | Freq: Every day | ORAL | Status: DC
  Administered 2013-08-26 – 2013-08-27 (×2): 1000 ug via ORAL
  Filled 2013-08-26 (×3): qty 1

## 2013-08-26 MED ORDER — INSULIN ASPART 100 UNIT/ML ~~LOC~~ SOLN
0.0000 [IU] | Freq: Three times a day (TID) | SUBCUTANEOUS | Status: DC
  Administered 2013-08-26: 3 [IU] via SUBCUTANEOUS
  Administered 2013-08-26: 4 [IU] via SUBCUTANEOUS
  Administered 2013-08-27: 3 [IU] via SUBCUTANEOUS

## 2013-08-26 MED ORDER — HYDROCODONE-ACETAMINOPHEN 7.5-325 MG/15ML PO SOLN: 10.0000 mL | ORAL | Status: DC | PRN

## 2013-08-26 MED ORDER — PROMETHAZINE HCL 25 MG/ML IJ SOLN: 6.2500 mg | INTRAMUSCULAR | Status: DC | PRN

## 2013-08-26 MED ORDER — METFORMIN HCL ER 500 MG PO TB24
1000.0000 mg | ORAL_TABLET | Freq: Two times a day (BID) | ORAL | Status: DC
  Administered 2013-08-26 – 2013-08-27 (×3): 1000 mg via ORAL
  Filled 2013-08-26 (×5): qty 2

## 2013-08-26 MED ORDER — DEXTROSE-NACL 5-0.45 % IV SOLN
INTRAVENOUS | Status: DC
  Administered 2013-08-26: 14:00:00 via INTRAVENOUS
  Administered 2013-08-27: 75 mL/h via INTRAVENOUS

## 2013-08-26 MED ORDER — LIDOCAINE HCL (CARDIAC) 20 MG/ML IV SOLN
INTRAVENOUS | Status: DC | PRN
  Administered 2013-08-26: 20 mg via INTRAVENOUS

## 2013-08-26 MED ORDER — VITAMIN D 50 MCG (2000 UT) PO CAPS: 2000.0000 [IU] | ORAL_CAPSULE | Freq: Every day | ORAL | Status: DC

## 2013-08-26 MED ORDER — ROCURONIUM BROMIDE 100 MG/10ML IV SOLN
INTRAVENOUS | Status: DC | PRN
  Administered 2013-08-26: 10 mg via INTRAVENOUS
  Administered 2013-08-26: 30 mg via INTRAVENOUS

## 2013-08-26 MED ORDER — GLYCOPYRROLATE 0.2 MG/ML IJ SOLN
INTRAMUSCULAR | Status: DC | PRN
  Administered 2013-08-26: 0.4 mg via INTRAVENOUS

## 2013-08-26 MED ORDER — FENTANYL CITRATE 0.05 MG/ML IJ SOLN
INTRAMUSCULAR | Status: DC | PRN
  Administered 2013-08-26 (×3): 50 ug via INTRAVENOUS

## 2013-08-26 MED ORDER — OXYBUTYNIN CHLORIDE ER 15 MG PO TB24
15.0000 mg | ORAL_TABLET | Freq: Every day | ORAL | Status: DC
  Administered 2013-08-26 – 2013-08-27 (×2): 15 mg via ORAL
  Filled 2013-08-26 (×2): qty 1

## 2013-08-26 SURGICAL SUPPLY — 49 items
APPLIER CLIP 9.375 SM OPEN (CLIP)
CANISTER SUCTION 2500CC (MISCELLANEOUS) ×2 IMPLANT
CLEANER TIP ELECTROSURG 2X2 (MISCELLANEOUS) ×2 IMPLANT
CLIP APPLIE 9.375 SM OPEN (CLIP) IMPLANT
CONT SPEC 4OZ CLIKSEAL STRL BL (MISCELLANEOUS) IMPLANT
CORDS BIPOLAR (ELECTRODE) ×2 IMPLANT
COVER SURGICAL LIGHT HANDLE (MISCELLANEOUS) ×2 IMPLANT
CRADLE DONUT ADULT HEAD (MISCELLANEOUS) ×2 IMPLANT
DECANTER SPIKE VIAL GLASS SM (MISCELLANEOUS) IMPLANT
DERMABOND ADVANCED (GAUZE/BANDAGES/DRESSINGS) ×1
DERMABOND ADVANCED .7 DNX12 (GAUZE/BANDAGES/DRESSINGS) ×1 IMPLANT
DRAIN SNY 10 ROU (WOUND CARE) ×2 IMPLANT
ELECT COATED BLADE 2.86 ST (ELECTRODE) ×2 IMPLANT
ELECT REM PT RETURN 9FT ADLT (ELECTROSURGICAL) ×2
ELECTRODE REM PT RTRN 9FT ADLT (ELECTROSURGICAL) ×1 IMPLANT
EVACUATOR SILICONE 100CC (DRAIN) ×2 IMPLANT
GAUZE SPONGE 4X4 16PLY XRAY LF (GAUZE/BANDAGES/DRESSINGS) ×2 IMPLANT
GLOVE BIO SURGEON STRL SZ 6.5 (GLOVE) ×2 IMPLANT
GLOVE BIOGEL M 7.0 STRL (GLOVE) ×2 IMPLANT
GLOVE BIOGEL M STRL SZ7.5 (GLOVE) ×2 IMPLANT
GLOVE BIOGEL PI IND STRL 6.5 (GLOVE) ×2 IMPLANT
GLOVE BIOGEL PI INDICATOR 6.5 (GLOVE) ×2
GLOVE ECLIPSE 8.0 STRL XLNG CF (GLOVE) ×2 IMPLANT
GLOVE SURG SS PI 6.5 STRL IVOR (GLOVE) ×2 IMPLANT
GOWN STRL NON-REIN LRG LVL3 (GOWN DISPOSABLE) ×6 IMPLANT
GOWN STRL REIN XL XLG (GOWN DISPOSABLE) ×2 IMPLANT
KIT BASIN OR (CUSTOM PROCEDURE TRAY) ×2 IMPLANT
KIT ROOM TURNOVER OR (KITS) ×2 IMPLANT
MARKER SKIN DUAL TIP RULER LAB (MISCELLANEOUS) ×2 IMPLANT
NS IRRIG 1000ML POUR BTL (IV SOLUTION) ×2 IMPLANT
PAD ARMBOARD 7.5X6 YLW CONV (MISCELLANEOUS) ×2 IMPLANT
PENCIL BUTTON HOLSTER BLD 10FT (ELECTRODE) ×2 IMPLANT
SPECIMEN JAR SMALL (MISCELLANEOUS) ×2 IMPLANT
SPONGE INTESTINAL PEANUT (DISPOSABLE) ×2 IMPLANT
STAPLER VISISTAT 35W (STAPLE) ×2 IMPLANT
SUT CHROMIC 2 0 SH (SUTURE) IMPLANT
SUT ETHILON 3 0 FSL (SUTURE) IMPLANT
SUT ETHILON 3 0 PS 1 (SUTURE) ×2 IMPLANT
SUT ETHILON 5 0 PS 2 18 (SUTURE) IMPLANT
SUT SILK 2 0 REEL (SUTURE) IMPLANT
SUT SILK 3 0 REEL (SUTURE) ×4 IMPLANT
SUT SILK 4 0 REEL (SUTURE) IMPLANT
SUT VIC AB 3-0 FS2 27 (SUTURE) ×2 IMPLANT
SUT VICRYL 4-0 PS2 18IN ABS (SUTURE) ×2 IMPLANT
TAPE PAPER MEDFIX 1IN X 10YD (GAUZE/BANDAGES/DRESSINGS) ×2 IMPLANT
TOWEL OR 17X24 6PK STRL BLUE (TOWEL DISPOSABLE) IMPLANT
TOWEL OR 17X26 10 PK STRL BLUE (TOWEL DISPOSABLE) ×2 IMPLANT
TRAY ENT MC OR (CUSTOM PROCEDURE TRAY) ×2 IMPLANT
WATER STERILE IRR 1000ML POUR (IV SOLUTION) IMPLANT

## 2013-08-26 NOTE — Brief Op Note (Signed)
08/26/2013  11:09 AM  PATIENT:  Shelby Morrow  67 y.o. female  PRE-OPERATIVE DIAGNOSIS:  thyroglossal duct cyst  POST-OPERATIVE DIAGNOSIS:  thyroglossal duct cyst  PROCEDURE:  Procedure(s): EXCISIONAL THYROGLOSSAL DUCT CYST (N/A)  SURGEON:  Surgeon(s) and Role:    * Osborn Coho, MD - Primary    * Flo Shanks, MD - Assisting  PHYSICIAN ASSISTANT:   ASSISTANTS: Dr. Lazarus Salines   ANESTHESIA:   general  EBL:  Total I/O In: 1000 [I.V.:1000] Out: - EBL 50cc  BLOOD ADMINISTERED:none  DRAINS: (10 Fr) Jackson-Pratt drain(s) with closed bulb suction in the Ant neck   LOCAL MEDICATIONS USED:  LIDOCAINE  and Amount: 3 ml  SPECIMEN:  Source of Specimen:  Ant neck mass  DISPOSITION OF SPECIMEN:  PATHOLOGY  COUNTS:  YES  TOURNIQUET:  * No tourniquets in log *  DICTATION: .Other Dictation: Dictation Number [id number]  PLAN OF CARE: Admit for overnight observation  PATIENT DISPOSITION:  PACU - hemodynamically stable.   Delay start of Pharmacological VTE agent (>24hrs) due to surgical blood loss or risk of bleeding: no

## 2013-08-26 NOTE — Preoperative (Signed)
Beta Blockers   Reason not to administer Beta Blockers:Not Applicable 

## 2013-08-26 NOTE — Anesthesia Postprocedure Evaluation (Signed)
  Anesthesia Post-op Note  Patient: Shelby Morrow  Procedure(s) Performed: Procedure(s): EXCISIONAL THYROGLOSSAL DUCT CYST (N/A)  Patient Location: PACU  Anesthesia Type:General  Level of Consciousness: awake, alert , oriented and patient cooperative  Airway and Oxygen Therapy: Patient Spontanous Breathing and Patient connected to nasal cannula oxygen  Post-op Pain: 4 /10, mild  Post-op Assessment: Post-op Vital signs reviewed, Patient's Cardiovascular Status Stable, Respiratory Function Stable, Patent Airway, No signs of Nausea or vomiting and Pain level controlled  Post-op Vital Signs: Reviewed and stable  Complications: No apparent anesthesia complications

## 2013-08-26 NOTE — Anesthesia Procedure Notes (Signed)
Procedure Name: Intubation Date/Time: 08/26/2013 9:34 AM Performed by: Annalee Genta, DAVID Pre-anesthesia Checklist: Patient identified, Emergency Drugs available, Suction available, Patient being monitored and Timeout performed Patient Re-evaluated:Patient Re-evaluated prior to inductionOxygen Delivery Method: Circle system utilized Preoxygenation: Pre-oxygenation with 100% oxygen Intubation Type: IV induction Ventilation: Mask ventilation without difficulty Grade View: Grade II Tube type: Oral Tube size: 7.5 mm Number of attempts: 1 Airway Equipment and Method: Stylet and Video-laryngoscopy Placement Confirmation: ETT inserted through vocal cords under direct vision,  positive ETCO2 and breath sounds checked- equal and bilateral Secured at: 22 cm Tube secured with: Tape Dental Injury: Teeth and Oropharynx as per pre-operative assessment

## 2013-08-26 NOTE — H&P (Signed)
Shelby Morrow is an 66 y.o. female.   Chief Complaint: Ant neck mass HPI: pt worked up for thyroid mass, found to have an assymptomatic cystic mass in the submental area, c/w a thyroglossal duct cyst.  Past Medical History  Diagnosis Date  . Diabetes mellitus   . Hypertension   . Acid reflux   . Overactive bladder   . Anemia   . Hyperlipidemia   . Anxiety   . Depression     hx of .  Marland Kitchen H/O hiatal hernia   . Cancer 2001    Breast DCIS  . Goiter   . Positive TB test   . Deafness in right ear 08/26/2013    Past Surgical History  Procedure Laterality Date  . Hemangioma excision  2007    left nare  . Breast surgery Right 2005,2001  . Tummy tuck  2004  . Dilation and curettage of uterus  1980s  . Tympanoplasty  1965  . Abdominal hysterectomy  2006  . Adrenalectomy Right 1977  . Ganglion cyst excision Right 1950s  . Birth mark Right     ear, 5 surgeries  . Cardiac catheterization  2004    Family History  Problem Relation Age of Onset  . Cancer Maternal Grandmother     breast   Social History:  reports that she has never smoked. She has never used smokeless tobacco. She reports that she does not drink alcohol or use illicit drugs.  Allergies:  Allergies  Allergen Reactions  . Morphine And Related Shortness Of Breath  . Adhesive [Tape] Hives  . Amoxapine And Related     amoxicillin  . Demerol [Meperidine] Nausea Only  . Sulfa Antibiotics     Medications Prior to Admission  Medication Sig Dispense Refill  . amLODipine (NORVASC) 5 MG tablet Take 5 mg by mouth daily.      Marland Kitchen atenolol (TENORMIN) 25 MG tablet Take 25 mg by mouth 2 (two) times daily.       . Cholecalciferol (VITAMIN D) 2000 UNITS CAPS Take 2,000 Units by mouth daily.      . ferrous sulfate 325 (65 FE) MG tablet Take 325 mg by mouth daily with breakfast.      . insulin glargine (LANTUS) 100 UNIT/ML injection Inject 40 Units into the skin at bedtime. *per blood sugars 130=30 units, above 130=36 units       . metFORMIN (GLUCOPHAGE-XR) 500 MG 24 hr tablet Take 1,000 mg by mouth 2 (two) times daily.      Marland Kitchen oxybutynin (DITROPAN XL) 15 MG 24 hr tablet Take 15 mg by mouth daily.        . pantoprazole (PROTONIX) 40 MG tablet Take 40 mg by mouth daily.       . potassium chloride (MICRO-K) 10 MEQ CR capsule Take 10 mEq by mouth daily.       . rosuvastatin (CRESTOR) 10 MG tablet Take 10 mg by mouth daily.        Marland Kitchen VICTOZA 18 MG/3ML SOPN Inject 1.2 mg into the skin daily.       . vitamin B-12 (CYANOCOBALAMIN) 1000 MCG tablet Take 1,000 mcg by mouth daily.        Results for orders placed during the hospital encounter of 08/26/13 (from the past 48 hour(s))  GLUCOSE, CAPILLARY     Status: Abnormal   Collection Time    08/26/13  7:20 AM      Result Value Range   Glucose-Capillary 165 (*) 70 -  99 mg/dL   No results found.  Review of Systems  Constitutional: Negative.   Respiratory: Negative.   Cardiovascular: Negative.   Gastrointestinal: Negative.   Musculoskeletal: Negative.   Neurological: Negative.     Pulse 70, temperature 97.7 F (36.5 C), temperature source Oral, resp. rate 18, SpO2 97.00%. Physical Exam  Constitutional: She is oriented to person, place, and time. She appears well-developed and well-nourished.  Neck: Normal range of motion. Neck supple.  Ant submental neck mass  Cardiovascular: Normal rate.   Respiratory: Effort normal.  GI: Soft.  Musculoskeletal: Normal range of motion.  Neurological: She is alert and oriented to person, place, and time.     Assessment/Plan Plan adm for excision under GA as outpt with o/n abs.  Reann Dobias 08/26/2013, 9:09 AM

## 2013-08-26 NOTE — Progress Notes (Signed)
Day of Surgery  Subjective: Doing well. Pain controlled. Taking fluids well. No breathing issues  Objective: Vital signs in last 24 hours: Temp:  [97.4 F (36.3 C)-97.8 F (36.6 C)] 97.8 F (36.6 C) (12/03 1210) Pulse Rate:  [59-80] 70 (12/03 1513) Resp:  [13-18] 16 (12/03 1513) BP: (97-144)/(60-80) 120/67 mmHg (12/03 1513) SpO2:  [91 %-100 %] 98 % (12/03 1536) Weight:  [86.183 kg (190 lb)] 86.183 kg (190 lb) (12/03 0917) Last BM Date: 08/25/13  Intake/Output from previous day:   Intake/Output this shift:    wound looks excellect. no hematoma. voice normal jp functioning  Lab Results:   Recent Labs  08/26/13 1340  WBC 7.0  HGB 13.0  HCT 39.2  PLT 188   BMET  Recent Labs  08/26/13 1340  CREATININE 0.81   PT/INR No results found for this basename: LABPROT, INR,  in the last 72 hours ABG No results found for this basename: PHART, PCO2, PO2, HCO3,  in the last 72 hours  Studies/Results: No results found.  Anti-infectives: Anti-infectives   Start     Dose/Rate Route Frequency Ordered Stop   08/26/13 0915  ciprofloxacin (CIPRO) IVPB 400 mg  Status:  Discontinued     400 mg 200 mL/hr over 60 Minutes Intravenous  Once 08/26/13 0913 08/26/13 0945   08/26/13 0913  ciprofloxacin (CIPRO) 400 MG/200ML IVPB    Comments:  Shelby Morrow   : cabinet override      08/26/13 0913 08/26/13 0937      Assessment/Plan: s/p Procedure(s): EXCISIONAL THYROGLOSSAL DUCT CYST (N/A) doing well drain out in am.   LOS: 0 days    Shelby Morrow 08/26/2013

## 2013-08-26 NOTE — Addendum Note (Signed)
Addended by: Dinna Severs on: 08/26/2013 09:17 AM   Modules accepted: Orders  

## 2013-08-26 NOTE — Transfer of Care (Signed)
Immediate Anesthesia Transfer of Care Note  Patient: Shelby Morrow  Procedure(s) Performed: Procedure(s): EXCISIONAL THYROGLOSSAL DUCT CYST (N/A)  Patient Location: PACU  Anesthesia Type:General  Level of Consciousness: awake, alert , oriented and sedated  Airway & Oxygen Therapy: Patient Spontanous Breathing and Patient connected to nasal cannula oxygen  Post-op Assessment: Report given to PACU RN, Post -op Vital signs reviewed and stable and Patient moving all extremities  Post vital signs: Reviewed and stable  Complications: No apparent anesthesia complications

## 2013-08-26 NOTE — Anesthesia Preprocedure Evaluation (Addendum)
Anesthesia Evaluation  Patient identified by MRN, date of birth, ID band Patient awake    Reviewed: Allergy & Precautions, H&P , NPO status , Patient's Chart, lab work & pertinent test results, reviewed documented beta blocker date and time   History of Anesthesia Complications Negative for: history of anesthetic complications  Airway Mallampati: II TM Distance: >3 FB Neck ROM: Full    Dental  (+) Caps and Dental Advisory Given   Pulmonary neg pulmonary ROS,  breath sounds clear to auscultation  Pulmonary exam normal       Cardiovascular hypertension, Pt. on medications and Pt. on home beta blockers Rhythm:Regular Rate:Normal     Neuro/Psych negative neurological ROS     GI/Hepatic Neg liver ROS, hiatal hernia, GERD-  Medicated and Controlled,  Endo/Other  diabetes (glu 165), Oral Hypoglycemic Agents and Insulin DependentMorbid obesity  Renal/GU negative Renal ROS     Musculoskeletal   Abdominal (+) + obese,   Peds  Hematology   Anesthesia Other Findings H/o DCIS breast  Reproductive/Obstetrics                          Anesthesia Physical Anesthesia Plan  ASA: II  Anesthesia Plan: General   Post-op Pain Management:    Induction: Intravenous  Airway Management Planned: Oral ETT and Video Laryngoscope Planned  Additional Equipment:   Intra-op Plan:   Post-operative Plan: Extubation in OR  Informed Consent: I have reviewed the patients History and Physical, chart, labs and discussed the procedure including the risks, benefits and alternatives for the proposed anesthesia with the patient or authorized representative who has indicated his/her understanding and acceptance.   Dental advisory given  Plan Discussed with: CRNA and Surgeon  Anesthesia Plan Comments: (Plan routine monitors, GETA with VideoGlide intubation (pt very concerned about dental damage))        Anesthesia  Quick Evaluation

## 2013-08-27 LAB — GLUCOSE, CAPILLARY: Glucose-Capillary: 146 mg/dL — ABNORMAL HIGH (ref 70–99)

## 2013-08-27 NOTE — Progress Notes (Signed)
Discharge instructions and prescription given to pt.  Explained instructions and pt. Verbalizes understanding.  Pt. To follow-up with MD within 10 days. IV taken out. Discharged to home with daughter. Vanice Sarah

## 2013-08-27 NOTE — Op Note (Signed)
NAMEVISTA, SAWATZKY              ACCOUNT NO.:  [id number]  MEDICAL RECORD NO.:  [id number]  LOCATION:  6N19C                        FACILITY:  [facility]  PHYSICIAN:  Kinnie Scales. Tamyka Bezio, M.D.DATE OF BIRTH:  [birth year]  DATE OF PROCEDURE:  08/26/2013 DATE OF DISCHARGE:                              OPERATIVE REPORT   PREOPERATIVE DIAGNOSIS:  Anterior cystic neck mass consistent with thyroglossal duct cyst.  POSTOPERATIVE DIAGNOSIS:  Anterior cystic neck mass consistent with thyroglossal duct cyst.  INDICATION FOR SURGERY:  Anterior cystic neck mass consistent with thyroglossal duct cyst.  SURGICAL PROCEDURE:  Transcervical excision of thyroglossal duct cyst with Sistrunk procedure.  ANESTHESIA:  General endotracheal.  COMPLICATIONS:  None.  ESTIMATED BLOOD LOSS:  Less than 50 mL.  SURGEON:  Kinnie Scales. Annalee Genta, M.D.  ASSISTANT:  Gloris Manchester. Lazarus Salines, M.D.  The patient was transferred from the operating room to the recovery room in stable condition.  BRIEF HISTORY:  The patient is a 67 year old, white female who was referred by Dr. Gerrit Friends for evaluation of a anterior neck cystic mass. The patient had undergone workup for a thyroid nodule including ultrasound and needle biopsy which was benign.  On the ultrasound, the patient was noted to have a complex cystic mass in the upper aspect of the neck adjacent to the hyoid bone.  A CT scan of the neck was obtained which showed a 1.5 x 2.6 complex cystic mass adjacent to the thyroid cartilage notch and deep to the hyoid bone.  Consistent with a thyroglossal duct cyst.  The patient was asymptomatic, no history of infection or trauma.  No change, no pain in the area, and no erythema. Given her history and findings, we recommended excision of the thyroglossal duct cyst under general anesthesia as an outpatient with overnight observation.  The risks and benefits of procedure were discussed in detail with the patient who understood and  concurred with our plan for surgery which is scheduled on elective basis on August 26, 2013.  PROCEDURE:  The patient was brought to the operating room and placed in supine position on the operating table.  General endotracheal anesthesia was established without difficulty.  When the patient was adequately anesthetized, she was positioned and prepped and draped.  Her skin was injected with a total of 3 mL of 1% lidocaine with 1:100,000 solution of epinephrine injected in the subcutaneous fashion, and a horizontal line overlying the palpable mass.  After allowing the adequate time for vasoconstriction, hemostasis, a 3-cm horizontally oriented incision was created in the skin.  This carried through the underlying subcutaneous tissue and fat to the level of the strap muscles.  Careful dissection in the midline overlying the thyroid cartilage revealed a cystic/soft tissue mass adjacent to the thyroid cartilage notch.  Dissection was carried inferiorly to the level of the superior aspect of the thyroid lobes bilaterally.  Strap muscles were elevated laterally and careful soft tissue dissection was then carried out along the anterior aspect of the thyroid cartilage to the level of thyroid notch.  Superiorly dissection was carried out along the hyoid bone, skeletonizing the bone and removing the attachments of the strap muscles in the lateral 1/3rd on each side.  The  hyoid bone was then transected with bone rongeur and the mid 1/3rd dissection was removed with the surgical specimen. Dissection was then carried out superiorly along the tongue musculature and its attachment to the hyoid bone.  There was no evidence of a cystic tract or mass in this area, carrying through the tongue musculature to the deep aspect of the entire mass which was carefully dissected from the surrounding structures, as well as the overlying musculature and bony tissue was removed and sent to Pathology for gross  microscopic evaluation.  The patient's wound was then thoroughly irrigated and several small areas of point hemorrhage were cauterized with bipolar cautery and suture ligature.  There was no significant bleeding.  A 10- [place] round drain was then placed in the base of the incision and brought out through the neck.  The wound was closed in multiple layers beginning with 3-0 Vicryl suture in an interrupted fashion to reapproximate the muscular layer and platysma.  Deep subcutaneous tissue consisting of interrupted 3-0 Vicryl and final subcutaneous closure with 4-0 Vicryl suture in an interrupted fashion.  The skin edge was closed with Dermabond surgical glue.  The patient was then awakened from anesthetic.  She was extubated and transferred from the operating room to the recovery room in stable condition.  There were no complications. The blood loss was approximately 50 mL.          ______________________________ Kinnie Scales. Annalee Genta, M.D.     DLS/MEDQ  D:  16/06/9603  T:  08/27/2013  Job:  [id number]  cc:   Velora Heckler, MD Thora Lance, M.D.

## 2013-08-27 NOTE — Discharge Summary (Signed)
Physician Discharge Summary  Patient ID: Shelby Morrow MRN: [id number] DOB/AGE: [birth year] 67 y.o.  Admit date: 08/26/2013 Discharge date: 08/27/2013  Admission Diagnoses:  Active Problems:   Thyroglossal duct cyst   Discharge Diagnoses:  Same  Surgeries: Procedure(s): EXCISIONAL THYROGLOSSAL DUCT CYST on 08/26/2013   Consultants: None  Discharged Condition: Improved  Hospital Course: Shelby Morrow is an 67 y.o. female who was admitted 08/26/2013 with a diagnosis of thyroglossal duct cyst and went to the operating room on 08/26/2013 and underwent the above named procedures.   Stable and d/c'ed on POD #1.  Recent vital signs:  Filed Vitals:   08/27/13 0524  BP: 107/53  Pulse: 80  Temp: 98.2 F (36.8 C)  Resp: 18    Recent laboratory studies:  Results for orders placed during the hospital encounter of 08/26/13  GLUCOSE, CAPILLARY      Result Value Range   Glucose-Capillary 165 (*) 70 - 99 mg/dL  GLUCOSE, CAPILLARY      Result Value Range   Glucose-Capillary 130 (*) 70 - 99 mg/dL   Comment 1 Documented in Chart     Comment 2 Notify RN    CBC      Result Value Range   WBC 7.0  4.0 - 10.5 K/uL   RBC 4.31  3.87 - 5.11 MIL/uL   Hemoglobin 13.0  12.0 - 15.0 g/dL   HCT 40.9  81.1 - 91.4 %   MCV 91.0  78.0 - 100.0 fL   MCH 30.2  26.0 - 34.0 pg   MCHC 33.2  30.0 - 36.0 g/dL   RDW 78.2  95.6 - 21.3 %   Platelets 188  150 - 400 K/uL  CREATININE, SERUM      Result Value Range   Creatinine, Ser 0.81  0.50 - 1.10 mg/dL   GFR calc non Af Amer 73 (*) >90 mL/min   GFR calc Af Amer 85 (*) >90 mL/min  GLUCOSE, CAPILLARY      Result Value Range   Glucose-Capillary 150 (*) 70 - 99 mg/dL  GLUCOSE, CAPILLARY      Result Value Range   Glucose-Capillary 182 (*) 70 - 99 mg/dL   Comment 1 Notify RN    GLUCOSE, CAPILLARY      Result Value Range   Glucose-Capillary 201 (*) 70 - 99 mg/dL  GLUCOSE, CAPILLARY      Result Value Range   Glucose-Capillary 146 (*) 70 - 99 mg/dL    Comment 1 Notify RN      Discharge Medications:     Medication List         amLODipine 5 MG tablet  Commonly known as:  NORVASC  Take 5 mg by mouth daily.     atenolol 25 MG tablet  Commonly known as:  TENORMIN  Take 25 mg by mouth 2 (two) times daily.     ferrous sulfate 325 (65 FE) MG tablet  Take 325 mg by mouth daily with breakfast.     HYDROcodone-acetaminophen 7.5-325 mg/15 ml solution  Commonly known as:  HYCET  Take 10-15 mLs by mouth every 4 (four) hours as needed for moderate pain.     insulin glargine 100 UNIT/ML injection  Commonly known as:  LANTUS  Inject 40 Units into the skin at bedtime. *per blood sugars 130=30 units, above 130=36 units     metFORMIN 500 MG 24 hr tablet  Commonly known as:  GLUCOPHAGE-XR  Take 1,000 mg by mouth 2 (two) times daily.  oxybutynin 15 MG 24 hr tablet  Commonly known as:  DITROPAN XL  Take 15 mg by mouth daily.     pantoprazole 40 MG tablet  Commonly known as:  PROTONIX  Take 40 mg by mouth daily.     potassium chloride 10 MEQ CR capsule  Commonly known as:  MICRO-K  Take 10 mEq by mouth daily.     rosuvastatin 10 MG tablet  Commonly known as:  CRESTOR  Take 10 mg by mouth daily.     VICTOZA 18 MG/3ML Sopn  Generic drug:  Liraglutide  Inject 1.2 mg into the skin daily.     vitamin B-12 1000 MCG tablet  Commonly known as:  CYANOCOBALAMIN  Take 1,000 mcg by mouth daily.     Vitamin D 2000 UNITS Caps  Take 2,000 Units by mouth daily.        Diagnostic Studies: Dg Chest 2 View  08/14/2013   CLINICAL DATA:  Preop thyroglossal duct cyst excision  EXAM: CHEST  2 VIEW  COMPARISON:  None.  FINDINGS: Lungs are essentially clear. No focal consolidation. No pleural effusion or pneumothorax.  The heart is normal size.  Degenerative changes of the visualized thoracolumbar spine.  Surgical clips in the right abdomen.  IMPRESSION: No evidence of acute cardiopulmonary disease.   Electronically Signed   By: Charline Bills M.D.   On: 08/14/2013 15:07    Disposition: 01-Home or Self Care      Discharge Orders   Future Orders Complete By Expires   Diet - low sodium heart healthy  As directed    Diet - low sodium heart healthy  As directed    Discharge instructions  As directed    Comments:     1. Limited activity 2. Liquid and soft diet, advance as tolerated 3. May bathe and shower day after surgery 4. Wound care - Gentle cleaning with soap and water 5. DO NOT APPLY ANY OINTMENT 6. Elevate Head of Bed   Increase activity slowly  As directed    Increase activity slowly  As directed       Follow-up Information   Follow up with Javohn Basey, MD. Schedule an appointment as soon as possible for a visit in 10 days.   Specialty:  Otolaryngology   Contact information:   [address] [place] [address] [phone]        Signed: Osborn Coho 08/27/2013, 8:37 AM

## 2013-08-27 NOTE — Progress Notes (Signed)
   ENT Progress Note: POD #1 s/p Procedure(s): EXCISIONAL THYROGLOSSAL DUCT CYST   Subjective: C/O neck pain  Objective: Vital signs in last 24 hours: Temp:  [97.8 F (36.6 C)-98.2 F (36.8 C)] 98.2 F (36.8 C) (12/04 0524) Pulse Rate:  [59-80] 80 (12/04 0524) Resp:  [13-18] 18 (12/04 0524) BP: (97-144)/(53-80) 107/53 mmHg (12/04 0524) SpO2:  [91 %-100 %] 97 % (12/04 0524) Weight:  [86.183 kg (190 lb)] 86.183 kg (190 lb) (12/03 0917) Weight change:  Last BM Date: 08/25/13  Intake/Output from previous day: 12/03 0701 - 12/04 0700 In: 2411.3 [I.V.:2411.3] Out: 978 [Urine:900; Drains:63; Blood:15] Intake/Output this shift:    Labs:  Recent Labs  08/26/13 1340  WBC 7.0  HGB 13.0  HCT 39.2  PLT 188   No results found for this basename: NA, K, CL, CO2, GLUCOSE, BUN, CREATININR, CALCIUM,  in the last 72 hours  Studies/Results: No results found.   PHYSICAL EXAM: Inc intact No erythema/swelling JP removed   Assessment/Plan: Stable postop D/C to home   Shelby Morrow 08/27/2013, 8:33 AM

## 2013-08-28 ENCOUNTER — Encounter (HOSPITAL_COMMUNITY): Payer: Self-pay | Admitting: Otolaryngology

## 2013-10-08 DIAGNOSIS — N951 Menopausal and female climacteric states: Secondary | ICD-10-CM | POA: Diagnosis not present

## 2013-10-08 DIAGNOSIS — Z01419 Encounter for gynecological examination (general) (routine) without abnormal findings: Secondary | ICD-10-CM | POA: Diagnosis not present

## 2013-10-09 ENCOUNTER — Other Ambulatory Visit: Payer: Self-pay

## 2013-10-09 DIAGNOSIS — Z853 Personal history of malignant neoplasm of breast: Secondary | ICD-10-CM

## 2013-10-09 DIAGNOSIS — Z1231 Encounter for screening mammogram for malignant neoplasm of breast: Secondary | ICD-10-CM

## 2013-10-15 DIAGNOSIS — E8941 Symptomatic postprocedural ovarian failure: Secondary | ICD-10-CM | POA: Diagnosis not present

## 2013-11-02 ENCOUNTER — Ambulatory Visit: Payer: Medicare Other

## 2013-11-11 ENCOUNTER — Other Ambulatory Visit (INDEPENDENT_AMBULATORY_CARE_PROVIDER_SITE_OTHER): Payer: Self-pay | Admitting: Surgery

## 2013-11-11 ENCOUNTER — Ambulatory Visit (INDEPENDENT_AMBULATORY_CARE_PROVIDER_SITE_OTHER): Payer: Medicare Other | Admitting: Surgery

## 2013-11-11 ENCOUNTER — Encounter (INDEPENDENT_AMBULATORY_CARE_PROVIDER_SITE_OTHER): Payer: Self-pay | Admitting: Surgery

## 2013-11-11 VITALS — BP 116/72 | HR 80 | Temp 98.6°F | Resp 14 | Ht 64.0 in | Wt 191.0 lb

## 2013-11-11 DIAGNOSIS — E042 Nontoxic multinodular goiter: Secondary | ICD-10-CM | POA: Diagnosis not present

## 2013-11-11 DIAGNOSIS — E041 Nontoxic single thyroid nodule: Secondary | ICD-10-CM

## 2013-11-11 NOTE — Progress Notes (Signed)
General Surgery [facility]  Chief Complaint  Patient presents with  . Follow-up    thyroid nodules, status thyroglossal duct cyst excision    HISTORY: The patient is a 68 year old female evaluated in the summer of 2014 for thyroid nodules and a thyroglossal duct cyst. The dominant thyroid nodule measured 2.9 cm. Fine-needle aspiration biopsy was obtained and showed benign cytopathology. Patient was referred to [facility] and underwent excision of her thyroglossal duct cyst. She has done well postoperatively. Patient returns today for assessment of her thyroid nodules.  PERTINENT REVIEW OF SYSTEMS: Denies dysphagia. Denies shortness of breath. Denies new palpable nodules. Denies tremor. Denies palpitations.  EXAM: HEENT: normocephalic; pupils equal and reactive; sclerae clear; dentition good; mucous membranes moist NECK:  Multinodular thyroid gland, relatively firm, without discrete or dominant mass; symmetric on extension; no palpable anterior or posterior cervical lymphadenopathy; no supraclavicular masses; no tenderness; well-healed surgical incision in the low submental location CHEST: clear to auscultation bilaterally without rales, rhonchi, or wheezes CARDIAC: regular rate and rhythm without significant murmur; peripheral pulses are full EXT:  non-tender without edema; no deformity NEURO: no gross focal deficits; no sign of tremor   IMPRESSION: #1 multinodular thyroid, clinically stable #2 status post thyroglossal duct cyst excision  PLAN: Patient and I discussed the above findings. We will obtain a thyroid ultrasound at this time for comparison to her study from July of 2014. If it is stable, then we will wait 1 year before repeating her thyroid ultrasound and seeing her back in the office for physical examination. If there are any worrisome findings on the thyroid ultrasound, I will be in contact with the patient regarding further evaluation.  Patient will return  in 1 year for physical examination and evaluation.  Earnstine Regal, MD, [facility] Office: [phone]  Visit Diagnoses: 1. Multinodular goiter (nontoxic)

## 2013-11-11 NOTE — Patient Instructions (Signed)
  COCOA BUTTER & VITAMIN E CREAM  ([employer] or other brand)  Apply cocoa butter/vitamin E cream to your incision 2 - 3 times daily.  Massage cream into incision for one minute with each application.  Use sunscreen (50 SPF or higher) for first 6 months after surgery if area is exposed to sun.  You may substitute Mederma or other scar reducing creams as desired.   

## 2013-11-13 ENCOUNTER — Ambulatory Visit
Admission: RE | Admit: 2013-11-13 | Discharge: 2013-11-13 | Disposition: A | Discharge status: Home / Self Care | Payer: Medicare Other | Source: Ambulatory Visit

## 2013-11-13 ENCOUNTER — Other Ambulatory Visit: Payer: Self-pay

## 2013-11-13 DIAGNOSIS — Z1231 Encounter for screening mammogram for malignant neoplasm of breast: Secondary | ICD-10-CM | POA: Diagnosis not present

## 2013-11-13 DIAGNOSIS — Z853 Personal history of malignant neoplasm of breast: Secondary | ICD-10-CM

## 2013-11-17 ENCOUNTER — Ambulatory Visit
Admission: RE | Admit: 2013-11-17 | Discharge: 2013-11-17 | Disposition: A | Discharge status: Home / Self Care | Payer: Medicare Other | Source: Ambulatory Visit | Attending: Surgery | Admitting: Surgery

## 2013-11-17 DIAGNOSIS — E041 Nontoxic single thyroid nodule: Secondary | ICD-10-CM

## 2013-11-17 DIAGNOSIS — E042 Nontoxic multinodular goiter: Secondary | ICD-10-CM | POA: Diagnosis not present

## 2013-11-23 ENCOUNTER — Telehealth (INDEPENDENT_AMBULATORY_CARE_PROVIDER_SITE_OTHER): Payer: Self-pay | Admitting: General Surgery

## 2013-11-23 NOTE — Telephone Encounter (Signed)
Pt called for results of her thyroid scan from last week.  She has been released for 1 year from Dr. Harlow Asa.  Reviewed the report and gave her "minimal changes" update.  She will call for sooner appt if she worsens.

## 2013-11-24 DIAGNOSIS — Z1331 Encounter for screening for depression: Secondary | ICD-10-CM | POA: Diagnosis not present

## 2013-11-24 DIAGNOSIS — IMO0001 Reserved for inherently not codable concepts without codable children: Secondary | ICD-10-CM | POA: Diagnosis not present

## 2013-11-24 DIAGNOSIS — K219 Gastro-esophageal reflux disease without esophagitis: Secondary | ICD-10-CM | POA: Diagnosis not present

## 2013-11-24 DIAGNOSIS — E785 Hyperlipidemia, unspecified: Secondary | ICD-10-CM | POA: Diagnosis not present

## 2013-11-24 DIAGNOSIS — Z23 Encounter for immunization: Secondary | ICD-10-CM | POA: Diagnosis not present

## 2013-11-24 DIAGNOSIS — D126 Benign neoplasm of colon, unspecified: Secondary | ICD-10-CM | POA: Diagnosis not present

## 2013-11-24 DIAGNOSIS — I10 Essential (primary) hypertension: Secondary | ICD-10-CM | POA: Diagnosis not present

## 2013-11-30 NOTE — Telephone Encounter (Signed)
Ultrasound of thyroid shows minimal changes compared to study in 2014.  Biopsy of dominant nodule 2014 was benign.  Will request follow up ultrasound in one year.  Patient to return for physical exam at that time.  Earnstine Regal, MD, [facility] Office: [phone]

## 2013-12-01 NOTE — Telephone Encounter (Signed)
Pt notified of result per Dr Gala Lewandowsky note. Pt on recall for 1 year.

## 2013-12-15 DIAGNOSIS — K219 Gastro-esophageal reflux disease without esophagitis: Secondary | ICD-10-CM | POA: Diagnosis not present

## 2013-12-15 DIAGNOSIS — E119 Type 2 diabetes mellitus without complications: Secondary | ICD-10-CM | POA: Diagnosis not present

## 2013-12-15 DIAGNOSIS — Z86010 Personal history of colonic polyps: Secondary | ICD-10-CM | POA: Diagnosis not present

## 2014-02-22 ENCOUNTER — Other Ambulatory Visit: Payer: Self-pay | Admitting: Gastroenterology

## 2014-02-22 DIAGNOSIS — K5730 Diverticulosis of large intestine without perforation or abscess without bleeding: Secondary | ICD-10-CM | POA: Diagnosis not present

## 2014-02-22 DIAGNOSIS — K3189 Other diseases of stomach and duodenum: Secondary | ICD-10-CM | POA: Diagnosis not present

## 2014-02-22 DIAGNOSIS — Z09 Encounter for follow-up examination after completed treatment for conditions other than malignant neoplasm: Secondary | ICD-10-CM | POA: Diagnosis not present

## 2014-02-22 DIAGNOSIS — K219 Gastro-esophageal reflux disease without esophagitis: Secondary | ICD-10-CM | POA: Diagnosis not present

## 2014-02-22 DIAGNOSIS — D131 Benign neoplasm of stomach: Secondary | ICD-10-CM | POA: Diagnosis not present

## 2014-02-22 DIAGNOSIS — Z86010 Personal history of colonic polyps: Secondary | ICD-10-CM | POA: Diagnosis not present

## 2014-03-29 DIAGNOSIS — K219 Gastro-esophageal reflux disease without esophagitis: Secondary | ICD-10-CM | POA: Diagnosis not present

## 2014-04-06 DIAGNOSIS — I10 Essential (primary) hypertension: Secondary | ICD-10-CM | POA: Diagnosis not present

## 2014-04-06 DIAGNOSIS — E119 Type 2 diabetes mellitus without complications: Secondary | ICD-10-CM | POA: Diagnosis not present

## 2014-04-06 DIAGNOSIS — Z79899 Other long term (current) drug therapy: Secondary | ICD-10-CM | POA: Diagnosis not present

## 2014-04-06 DIAGNOSIS — K219 Gastro-esophageal reflux disease without esophagitis: Secondary | ICD-10-CM | POA: Diagnosis not present

## 2014-04-06 DIAGNOSIS — E785 Hyperlipidemia, unspecified: Secondary | ICD-10-CM | POA: Diagnosis not present

## 2014-04-14 DIAGNOSIS — D2310 Other benign neoplasm of skin of unspecified eyelid, including canthus: Secondary | ICD-10-CM | POA: Diagnosis not present

## 2014-04-14 DIAGNOSIS — E119 Type 2 diabetes mellitus without complications: Secondary | ICD-10-CM | POA: Diagnosis not present

## 2014-04-14 DIAGNOSIS — H5200 Hypermetropia, unspecified eye: Secondary | ICD-10-CM | POA: Diagnosis not present

## 2014-05-04 DIAGNOSIS — L819 Disorder of pigmentation, unspecified: Secondary | ICD-10-CM | POA: Diagnosis not present

## 2014-05-04 DIAGNOSIS — D2330 Other benign neoplasm of skin of unspecified part of face: Secondary | ICD-10-CM | POA: Diagnosis not present

## 2014-05-04 DIAGNOSIS — D1801 Hemangioma of skin and subcutaneous tissue: Secondary | ICD-10-CM | POA: Diagnosis not present

## 2014-05-04 DIAGNOSIS — L821 Other seborrheic keratosis: Secondary | ICD-10-CM | POA: Diagnosis not present

## 2014-05-04 DIAGNOSIS — D239 Other benign neoplasm of skin, unspecified: Secondary | ICD-10-CM | POA: Diagnosis not present

## 2014-05-04 DIAGNOSIS — D234 Other benign neoplasm of skin of scalp and neck: Secondary | ICD-10-CM | POA: Diagnosis not present

## 2014-06-24 DIAGNOSIS — Z23 Encounter for immunization: Secondary | ICD-10-CM | POA: Diagnosis not present

## 2014-08-31 DIAGNOSIS — Z09 Encounter for follow-up examination after completed treatment for conditions other than malignant neoplasm: Secondary | ICD-10-CM | POA: Diagnosis not present

## 2014-08-31 DIAGNOSIS — E780 Pure hypercholesterolemia: Secondary | ICD-10-CM | POA: Diagnosis not present

## 2014-08-31 DIAGNOSIS — K219 Gastro-esophageal reflux disease without esophagitis: Secondary | ICD-10-CM | POA: Diagnosis not present

## 2014-08-31 DIAGNOSIS — E119 Type 2 diabetes mellitus without complications: Secondary | ICD-10-CM | POA: Diagnosis not present

## 2014-08-31 DIAGNOSIS — I10 Essential (primary) hypertension: Secondary | ICD-10-CM | POA: Diagnosis not present

## 2014-08-31 DIAGNOSIS — E042 Nontoxic multinodular goiter: Secondary | ICD-10-CM | POA: Diagnosis not present

## 2014-08-31 DIAGNOSIS — E559 Vitamin D deficiency, unspecified: Secondary | ICD-10-CM | POA: Diagnosis not present

## 2014-08-31 DIAGNOSIS — G2581 Restless legs syndrome: Secondary | ICD-10-CM | POA: Diagnosis not present

## 2014-08-31 DIAGNOSIS — E669 Obesity, unspecified: Secondary | ICD-10-CM | POA: Diagnosis not present

## 2014-08-31 DIAGNOSIS — Z1389 Encounter for screening for other disorder: Secondary | ICD-10-CM | POA: Diagnosis not present

## 2014-10-12 ENCOUNTER — Other Ambulatory Visit (INDEPENDENT_AMBULATORY_CARE_PROVIDER_SITE_OTHER): Payer: Self-pay

## 2014-10-12 DIAGNOSIS — Z87798 Personal history of other (corrected) congenital malformations: Secondary | ICD-10-CM

## 2014-10-12 DIAGNOSIS — K219 Gastro-esophageal reflux disease without esophagitis: Secondary | ICD-10-CM | POA: Diagnosis not present

## 2014-10-12 DIAGNOSIS — R197 Diarrhea, unspecified: Secondary | ICD-10-CM | POA: Diagnosis not present

## 2014-10-12 NOTE — Addendum Note (Signed)
Addended by: Earnstine Regal on: 10/12/2014 03:29 PM   Modules accepted: Orders

## 2014-10-13 ENCOUNTER — Other Ambulatory Visit (INDEPENDENT_AMBULATORY_CARE_PROVIDER_SITE_OTHER): Payer: Self-pay | Admitting: *Deleted

## 2014-10-13 DIAGNOSIS — Z853 Personal history of malignant neoplasm of breast: Secondary | ICD-10-CM | POA: Diagnosis not present

## 2014-10-13 DIAGNOSIS — Z87798 Personal history of other (corrected) congenital malformations: Secondary | ICD-10-CM

## 2014-10-14 ENCOUNTER — Other Ambulatory Visit: Payer: Self-pay

## 2014-10-14 DIAGNOSIS — Z1231 Encounter for screening mammogram for malignant neoplasm of breast: Secondary | ICD-10-CM

## 2014-10-14 DIAGNOSIS — Z853 Personal history of malignant neoplasm of breast: Secondary | ICD-10-CM

## 2014-10-14 DIAGNOSIS — Z9011 Acquired absence of right breast and nipple: Secondary | ICD-10-CM

## 2014-10-25 ENCOUNTER — Ambulatory Visit
Admission: RE | Admit: 2014-10-25 | Discharge: 2014-10-25 | Disposition: A | Discharge status: Home / Self Care | Payer: Medicare Other | Source: Ambulatory Visit | Attending: Surgery | Admitting: Surgery

## 2014-10-25 DIAGNOSIS — E042 Nontoxic multinodular goiter: Secondary | ICD-10-CM | POA: Diagnosis not present

## 2014-11-15 ENCOUNTER — Ambulatory Visit
Admission: RE | Admit: 2014-11-15 | Discharge: 2014-11-15 | Disposition: A | Discharge status: Home / Self Care | Payer: Medicare Other | Source: Ambulatory Visit

## 2014-11-15 DIAGNOSIS — Z9011 Acquired absence of right breast and nipple: Secondary | ICD-10-CM

## 2014-11-15 DIAGNOSIS — Z1231 Encounter for screening mammogram for malignant neoplasm of breast: Secondary | ICD-10-CM | POA: Diagnosis not present

## 2014-11-15 DIAGNOSIS — Z853 Personal history of malignant neoplasm of breast: Secondary | ICD-10-CM

## 2014-12-02 ENCOUNTER — Other Ambulatory Visit: Payer: Self-pay | Admitting: Dermatology

## 2014-12-02 DIAGNOSIS — L820 Inflamed seborrheic keratosis: Secondary | ICD-10-CM | POA: Diagnosis not present

## 2014-12-02 DIAGNOSIS — D485 Neoplasm of uncertain behavior of skin: Secondary | ICD-10-CM | POA: Diagnosis not present

## 2014-12-02 DIAGNOSIS — L570 Actinic keratosis: Secondary | ICD-10-CM | POA: Diagnosis not present

## 2014-12-22 DIAGNOSIS — E042 Nontoxic multinodular goiter: Secondary | ICD-10-CM | POA: Diagnosis not present

## 2014-12-31 DIAGNOSIS — E119 Type 2 diabetes mellitus without complications: Secondary | ICD-10-CM | POA: Diagnosis not present

## 2014-12-31 DIAGNOSIS — K219 Gastro-esophageal reflux disease without esophagitis: Secondary | ICD-10-CM | POA: Diagnosis not present

## 2014-12-31 DIAGNOSIS — E042 Nontoxic multinodular goiter: Secondary | ICD-10-CM | POA: Diagnosis not present

## 2014-12-31 DIAGNOSIS — I10 Essential (primary) hypertension: Secondary | ICD-10-CM | POA: Diagnosis not present

## 2015-02-16 DIAGNOSIS — L820 Inflamed seborrheic keratosis: Secondary | ICD-10-CM | POA: Diagnosis not present

## 2015-02-16 DIAGNOSIS — L438 Other lichen planus: Secondary | ICD-10-CM | POA: Diagnosis not present

## 2015-02-16 DIAGNOSIS — L570 Actinic keratosis: Secondary | ICD-10-CM | POA: Diagnosis not present

## 2015-02-16 DIAGNOSIS — L821 Other seborrheic keratosis: Secondary | ICD-10-CM | POA: Diagnosis not present

## 2015-04-18 DIAGNOSIS — E119 Type 2 diabetes mellitus without complications: Secondary | ICD-10-CM | POA: Diagnosis not present

## 2015-05-11 DIAGNOSIS — D509 Iron deficiency anemia, unspecified: Secondary | ICD-10-CM | POA: Diagnosis not present

## 2015-05-11 DIAGNOSIS — I10 Essential (primary) hypertension: Secondary | ICD-10-CM | POA: Diagnosis not present

## 2015-05-11 DIAGNOSIS — K5900 Constipation, unspecified: Secondary | ICD-10-CM | POA: Diagnosis not present

## 2015-05-11 DIAGNOSIS — K219 Gastro-esophageal reflux disease without esophagitis: Secondary | ICD-10-CM | POA: Diagnosis not present

## 2015-05-11 DIAGNOSIS — Z794 Long term (current) use of insulin: Secondary | ICD-10-CM | POA: Diagnosis not present

## 2015-05-11 DIAGNOSIS — E119 Type 2 diabetes mellitus without complications: Secondary | ICD-10-CM | POA: Diagnosis not present

## 2015-05-12 DIAGNOSIS — D224 Melanocytic nevi of scalp and neck: Secondary | ICD-10-CM | POA: Diagnosis not present

## 2015-05-12 DIAGNOSIS — D225 Melanocytic nevi of trunk: Secondary | ICD-10-CM | POA: Diagnosis not present

## 2015-05-12 DIAGNOSIS — D1801 Hemangioma of skin and subcutaneous tissue: Secondary | ICD-10-CM | POA: Diagnosis not present

## 2015-05-12 DIAGNOSIS — Z419 Encounter for procedure for purposes other than remedying health state, unspecified: Secondary | ICD-10-CM | POA: Diagnosis not present

## 2015-05-12 DIAGNOSIS — L720 Epidermal cyst: Secondary | ICD-10-CM | POA: Diagnosis not present

## 2015-05-12 DIAGNOSIS — D2239 Melanocytic nevi of other parts of face: Secondary | ICD-10-CM | POA: Diagnosis not present

## 2015-05-12 DIAGNOSIS — L820 Inflamed seborrheic keratosis: Secondary | ICD-10-CM | POA: Diagnosis not present

## 2015-05-12 DIAGNOSIS — L918 Other hypertrophic disorders of the skin: Secondary | ICD-10-CM | POA: Diagnosis not present

## 2015-05-12 DIAGNOSIS — L814 Other melanin hyperpigmentation: Secondary | ICD-10-CM | POA: Diagnosis not present

## 2015-07-06 DIAGNOSIS — Z23 Encounter for immunization: Secondary | ICD-10-CM | POA: Diagnosis not present

## 2015-09-05 DIAGNOSIS — E042 Nontoxic multinodular goiter: Secondary | ICD-10-CM | POA: Diagnosis not present

## 2015-09-05 DIAGNOSIS — Z7984 Long term (current) use of oral hypoglycemic drugs: Secondary | ICD-10-CM | POA: Diagnosis not present

## 2015-09-05 DIAGNOSIS — E119 Type 2 diabetes mellitus without complications: Secondary | ICD-10-CM | POA: Diagnosis not present

## 2015-09-05 DIAGNOSIS — R8290 Unspecified abnormal findings in urine: Secondary | ICD-10-CM | POA: Diagnosis not present

## 2015-09-05 DIAGNOSIS — K219 Gastro-esophageal reflux disease without esophagitis: Secondary | ICD-10-CM | POA: Diagnosis not present

## 2015-09-05 DIAGNOSIS — I10 Essential (primary) hypertension: Secondary | ICD-10-CM | POA: Diagnosis not present

## 2015-09-05 DIAGNOSIS — E7800 Pure hypercholesterolemia, unspecified: Secondary | ICD-10-CM | POA: Diagnosis not present

## 2015-09-05 DIAGNOSIS — Z6833 Body mass index (BMI) 33.0-33.9, adult: Secondary | ICD-10-CM | POA: Diagnosis not present

## 2015-09-05 DIAGNOSIS — M79604 Pain in right leg: Secondary | ICD-10-CM | POA: Diagnosis not present

## 2015-09-05 DIAGNOSIS — E669 Obesity, unspecified: Secondary | ICD-10-CM | POA: Diagnosis not present

## 2015-09-05 DIAGNOSIS — Z1389 Encounter for screening for other disorder: Secondary | ICD-10-CM | POA: Diagnosis not present

## 2015-09-05 DIAGNOSIS — E611 Iron deficiency: Secondary | ICD-10-CM | POA: Diagnosis not present

## 2015-09-05 DIAGNOSIS — Z0000 Encounter for general adult medical examination without abnormal findings: Secondary | ICD-10-CM | POA: Diagnosis not present

## 2015-10-14 ENCOUNTER — Other Ambulatory Visit: Payer: Self-pay

## 2015-10-14 DIAGNOSIS — Z853 Personal history of malignant neoplasm of breast: Secondary | ICD-10-CM

## 2015-10-14 DIAGNOSIS — Z1231 Encounter for screening mammogram for malignant neoplasm of breast: Secondary | ICD-10-CM

## 2015-10-14 DIAGNOSIS — Z9011 Acquired absence of right breast and nipple: Secondary | ICD-10-CM

## 2015-10-17 DIAGNOSIS — Z01411 Encounter for gynecological examination (general) (routine) with abnormal findings: Secondary | ICD-10-CM | POA: Diagnosis not present

## 2015-11-15 DIAGNOSIS — H9011 Conductive hearing loss, unilateral, right ear, with unrestricted hearing on the contralateral side: Secondary | ICD-10-CM | POA: Diagnosis not present

## 2015-11-15 DIAGNOSIS — H9193 Unspecified hearing loss, bilateral: Secondary | ICD-10-CM | POA: Diagnosis not present

## 2015-11-15 DIAGNOSIS — H9202 Otalgia, left ear: Secondary | ICD-10-CM | POA: Diagnosis not present

## 2015-11-15 DIAGNOSIS — H6121 Impacted cerumen, right ear: Secondary | ICD-10-CM | POA: Diagnosis not present

## 2015-11-17 ENCOUNTER — Ambulatory Visit
Admission: RE | Admit: 2015-11-17 | Discharge: 2015-11-17 | Disposition: A | Discharge status: Home / Self Care | Payer: Medicare Other | Source: Ambulatory Visit

## 2015-11-17 DIAGNOSIS — Z1231 Encounter for screening mammogram for malignant neoplasm of breast: Secondary | ICD-10-CM | POA: Diagnosis not present

## 2015-11-17 DIAGNOSIS — Z853 Personal history of malignant neoplasm of breast: Secondary | ICD-10-CM

## 2015-11-17 DIAGNOSIS — Z9011 Acquired absence of right breast and nipple: Secondary | ICD-10-CM

## 2016-01-11 DIAGNOSIS — E119 Type 2 diabetes mellitus without complications: Secondary | ICD-10-CM | POA: Diagnosis not present

## 2016-01-11 DIAGNOSIS — Z794 Long term (current) use of insulin: Secondary | ICD-10-CM | POA: Diagnosis not present

## 2016-01-11 DIAGNOSIS — Z6833 Body mass index (BMI) 33.0-33.9, adult: Secondary | ICD-10-CM | POA: Diagnosis not present

## 2016-01-11 DIAGNOSIS — E669 Obesity, unspecified: Secondary | ICD-10-CM | POA: Diagnosis not present

## 2016-01-11 DIAGNOSIS — E7800 Pure hypercholesterolemia, unspecified: Secondary | ICD-10-CM | POA: Diagnosis not present

## 2016-01-11 DIAGNOSIS — K219 Gastro-esophageal reflux disease without esophagitis: Secondary | ICD-10-CM | POA: Diagnosis not present

## 2016-01-11 DIAGNOSIS — I10 Essential (primary) hypertension: Secondary | ICD-10-CM | POA: Diagnosis not present

## 2016-04-23 DIAGNOSIS — H2513 Age-related nuclear cataract, bilateral: Secondary | ICD-10-CM | POA: Diagnosis not present

## 2016-04-23 DIAGNOSIS — H5203 Hypermetropia, bilateral: Secondary | ICD-10-CM | POA: Diagnosis not present

## 2016-04-23 DIAGNOSIS — E119 Type 2 diabetes mellitus without complications: Secondary | ICD-10-CM | POA: Diagnosis not present

## 2016-05-11 DIAGNOSIS — D1801 Hemangioma of skin and subcutaneous tissue: Secondary | ICD-10-CM | POA: Diagnosis not present

## 2016-05-11 DIAGNOSIS — L720 Epidermal cyst: Secondary | ICD-10-CM | POA: Diagnosis not present

## 2016-05-11 DIAGNOSIS — L814 Other melanin hyperpigmentation: Secondary | ICD-10-CM | POA: Diagnosis not present

## 2016-05-11 DIAGNOSIS — D224 Melanocytic nevi of scalp and neck: Secondary | ICD-10-CM | POA: Diagnosis not present

## 2016-05-11 DIAGNOSIS — L700 Acne vulgaris: Secondary | ICD-10-CM | POA: Diagnosis not present

## 2016-05-11 DIAGNOSIS — L821 Other seborrheic keratosis: Secondary | ICD-10-CM | POA: Diagnosis not present

## 2016-05-14 DIAGNOSIS — E669 Obesity, unspecified: Secondary | ICD-10-CM | POA: Diagnosis not present

## 2016-05-14 DIAGNOSIS — I10 Essential (primary) hypertension: Secondary | ICD-10-CM | POA: Diagnosis not present

## 2016-05-14 DIAGNOSIS — Z794 Long term (current) use of insulin: Secondary | ICD-10-CM | POA: Diagnosis not present

## 2016-05-14 DIAGNOSIS — Z1389 Encounter for screening for other disorder: Secondary | ICD-10-CM | POA: Diagnosis not present

## 2016-05-14 DIAGNOSIS — E119 Type 2 diabetes mellitus without complications: Secondary | ICD-10-CM | POA: Diagnosis not present

## 2016-05-14 DIAGNOSIS — Z6833 Body mass index (BMI) 33.0-33.9, adult: Secondary | ICD-10-CM | POA: Diagnosis not present

## 2016-06-23 DIAGNOSIS — Z23 Encounter for immunization: Secondary | ICD-10-CM | POA: Diagnosis not present

## 2016-10-01 DIAGNOSIS — I10 Essential (primary) hypertension: Secondary | ICD-10-CM | POA: Diagnosis not present

## 2016-10-01 DIAGNOSIS — Z6833 Body mass index (BMI) 33.0-33.9, adult: Secondary | ICD-10-CM | POA: Diagnosis not present

## 2016-10-01 DIAGNOSIS — E119 Type 2 diabetes mellitus without complications: Secondary | ICD-10-CM | POA: Diagnosis not present

## 2016-10-01 DIAGNOSIS — Z794 Long term (current) use of insulin: Secondary | ICD-10-CM | POA: Diagnosis not present

## 2016-10-01 DIAGNOSIS — E7800 Pure hypercholesterolemia, unspecified: Secondary | ICD-10-CM | POA: Diagnosis not present

## 2016-10-01 DIAGNOSIS — Z1389 Encounter for screening for other disorder: Secondary | ICD-10-CM | POA: Diagnosis not present

## 2016-10-01 DIAGNOSIS — E669 Obesity, unspecified: Secondary | ICD-10-CM | POA: Diagnosis not present

## 2016-10-01 DIAGNOSIS — K219 Gastro-esophageal reflux disease without esophagitis: Secondary | ICD-10-CM | POA: Diagnosis not present

## 2016-10-01 DIAGNOSIS — N3281 Overactive bladder: Secondary | ICD-10-CM | POA: Diagnosis not present

## 2016-10-01 DIAGNOSIS — Z7984 Long term (current) use of oral hypoglycemic drugs: Secondary | ICD-10-CM | POA: Diagnosis not present

## 2016-10-01 DIAGNOSIS — Z0000 Encounter for general adult medical examination without abnormal findings: Secondary | ICD-10-CM | POA: Diagnosis not present

## 2016-10-12 ENCOUNTER — Other Ambulatory Visit: Payer: Self-pay | Admitting: Obstetrics and Gynecology

## 2016-10-12 DIAGNOSIS — Z1231 Encounter for screening mammogram for malignant neoplasm of breast: Secondary | ICD-10-CM

## 2016-10-19 DIAGNOSIS — Z9010 Acquired absence of unspecified breast and nipple: Secondary | ICD-10-CM | POA: Diagnosis not present

## 2016-10-19 DIAGNOSIS — Z8619 Personal history of other infectious and parasitic diseases: Secondary | ICD-10-CM | POA: Diagnosis not present

## 2016-10-19 DIAGNOSIS — Z9189 Other specified personal risk factors, not elsewhere classified: Secondary | ICD-10-CM | POA: Diagnosis not present

## 2016-10-24 ENCOUNTER — Other Ambulatory Visit: Payer: Self-pay | Admitting: Surgery

## 2016-10-24 DIAGNOSIS — E042 Nontoxic multinodular goiter: Secondary | ICD-10-CM

## 2016-10-25 ENCOUNTER — Ambulatory Visit
Admission: RE | Admit: 2016-10-25 | Discharge: 2016-10-25 | Disposition: A | Discharge status: Home / Self Care | Payer: Medicare Other | Source: Ambulatory Visit | Attending: Surgery | Admitting: Surgery

## 2016-10-25 DIAGNOSIS — E042 Nontoxic multinodular goiter: Secondary | ICD-10-CM

## 2016-10-26 DIAGNOSIS — E042 Nontoxic multinodular goiter: Secondary | ICD-10-CM | POA: Diagnosis not present

## 2016-11-21 ENCOUNTER — Ambulatory Visit
Admission: RE | Admit: 2016-11-21 | Discharge: 2016-11-21 | Disposition: A | Discharge status: Home / Self Care | Payer: Medicare Other | Source: Ambulatory Visit | Attending: Obstetrics and Gynecology | Admitting: Obstetrics and Gynecology

## 2016-11-21 ENCOUNTER — Other Ambulatory Visit: Payer: Self-pay | Admitting: Obstetrics and Gynecology

## 2016-11-21 DIAGNOSIS — Z1231 Encounter for screening mammogram for malignant neoplasm of breast: Secondary | ICD-10-CM

## 2017-02-07 DIAGNOSIS — E042 Nontoxic multinodular goiter: Secondary | ICD-10-CM | POA: Diagnosis not present

## 2017-02-07 DIAGNOSIS — Z7984 Long term (current) use of oral hypoglycemic drugs: Secondary | ICD-10-CM | POA: Diagnosis not present

## 2017-02-07 DIAGNOSIS — I10 Essential (primary) hypertension: Secondary | ICD-10-CM | POA: Diagnosis not present

## 2017-02-07 DIAGNOSIS — E119 Type 2 diabetes mellitus without complications: Secondary | ICD-10-CM | POA: Diagnosis not present

## 2017-02-07 DIAGNOSIS — K219 Gastro-esophageal reflux disease without esophagitis: Secondary | ICD-10-CM | POA: Diagnosis not present

## 2017-02-07 DIAGNOSIS — Z794 Long term (current) use of insulin: Secondary | ICD-10-CM | POA: Diagnosis not present

## 2017-02-07 DIAGNOSIS — E1165 Type 2 diabetes mellitus with hyperglycemia: Secondary | ICD-10-CM | POA: Diagnosis not present

## 2017-02-07 DIAGNOSIS — N3281 Overactive bladder: Secondary | ICD-10-CM | POA: Diagnosis not present

## 2017-04-11 DIAGNOSIS — H15102 Unspecified episcleritis, left eye: Secondary | ICD-10-CM | POA: Diagnosis not present

## 2017-04-16 DIAGNOSIS — H15102 Unspecified episcleritis, left eye: Secondary | ICD-10-CM | POA: Diagnosis not present

## 2017-04-29 DIAGNOSIS — E119 Type 2 diabetes mellitus without complications: Secondary | ICD-10-CM | POA: Diagnosis not present

## 2017-04-29 DIAGNOSIS — H2513 Age-related nuclear cataract, bilateral: Secondary | ICD-10-CM | POA: Diagnosis not present

## 2017-04-29 DIAGNOSIS — H5203 Hypermetropia, bilateral: Secondary | ICD-10-CM | POA: Diagnosis not present

## 2017-05-17 DIAGNOSIS — L820 Inflamed seborrheic keratosis: Secondary | ICD-10-CM | POA: Diagnosis not present

## 2017-05-17 DIAGNOSIS — D1801 Hemangioma of skin and subcutaneous tissue: Secondary | ICD-10-CM | POA: Diagnosis not present

## 2017-05-17 DIAGNOSIS — L720 Epidermal cyst: Secondary | ICD-10-CM | POA: Diagnosis not present

## 2017-05-17 DIAGNOSIS — L814 Other melanin hyperpigmentation: Secondary | ICD-10-CM | POA: Diagnosis not present

## 2017-05-17 DIAGNOSIS — D224 Melanocytic nevi of scalp and neck: Secondary | ICD-10-CM | POA: Diagnosis not present

## 2017-05-17 DIAGNOSIS — D171 Benign lipomatous neoplasm of skin and subcutaneous tissue of trunk: Secondary | ICD-10-CM | POA: Diagnosis not present

## 2017-05-17 DIAGNOSIS — Z419 Encounter for procedure for purposes other than remedying health state, unspecified: Secondary | ICD-10-CM | POA: Diagnosis not present

## 2017-05-17 DIAGNOSIS — L821 Other seborrheic keratosis: Secondary | ICD-10-CM | POA: Diagnosis not present

## 2017-05-17 DIAGNOSIS — D485 Neoplasm of uncertain behavior of skin: Secondary | ICD-10-CM | POA: Diagnosis not present

## 2017-06-24 DIAGNOSIS — Z23 Encounter for immunization: Secondary | ICD-10-CM | POA: Diagnosis not present

## 2017-06-24 DIAGNOSIS — K219 Gastro-esophageal reflux disease without esophagitis: Secondary | ICD-10-CM | POA: Diagnosis not present

## 2017-06-24 DIAGNOSIS — E119 Type 2 diabetes mellitus without complications: Secondary | ICD-10-CM | POA: Diagnosis not present

## 2017-06-24 DIAGNOSIS — Z794 Long term (current) use of insulin: Secondary | ICD-10-CM | POA: Diagnosis not present

## 2017-06-24 DIAGNOSIS — E1165 Type 2 diabetes mellitus with hyperglycemia: Secondary | ICD-10-CM | POA: Diagnosis not present

## 2017-06-24 DIAGNOSIS — I10 Essential (primary) hypertension: Secondary | ICD-10-CM | POA: Diagnosis not present

## 2017-07-22 DIAGNOSIS — T148XXA Other injury of unspecified body region, initial encounter: Secondary | ICD-10-CM | POA: Diagnosis not present

## 2017-09-05 ENCOUNTER — Other Ambulatory Visit: Payer: Self-pay | Admitting: Surgery

## 2017-09-05 DIAGNOSIS — E042 Nontoxic multinodular goiter: Secondary | ICD-10-CM

## 2017-10-02 ENCOUNTER — Ambulatory Visit
Admission: RE | Admit: 2017-10-02 | Discharge: 2017-10-02 | Disposition: A | Discharge status: Home / Self Care | Payer: Medicare Other | Source: Ambulatory Visit | Attending: Surgery | Admitting: Surgery

## 2017-10-02 DIAGNOSIS — E042 Nontoxic multinodular goiter: Secondary | ICD-10-CM | POA: Diagnosis not present

## 2017-10-04 DIAGNOSIS — I10 Essential (primary) hypertension: Secondary | ICD-10-CM | POA: Diagnosis not present

## 2017-10-04 DIAGNOSIS — H9191 Unspecified hearing loss, right ear: Secondary | ICD-10-CM | POA: Diagnosis not present

## 2017-10-04 DIAGNOSIS — K219 Gastro-esophageal reflux disease without esophagitis: Secondary | ICD-10-CM | POA: Diagnosis not present

## 2017-10-04 DIAGNOSIS — Z1389 Encounter for screening for other disorder: Secondary | ICD-10-CM | POA: Diagnosis not present

## 2017-10-04 DIAGNOSIS — E119 Type 2 diabetes mellitus without complications: Secondary | ICD-10-CM | POA: Diagnosis not present

## 2017-10-04 DIAGNOSIS — E538 Deficiency of other specified B group vitamins: Secondary | ICD-10-CM | POA: Diagnosis not present

## 2017-10-04 DIAGNOSIS — E669 Obesity, unspecified: Secondary | ICD-10-CM | POA: Diagnosis not present

## 2017-10-04 DIAGNOSIS — E7800 Pure hypercholesterolemia, unspecified: Secondary | ICD-10-CM | POA: Diagnosis not present

## 2017-10-04 DIAGNOSIS — E042 Nontoxic multinodular goiter: Secondary | ICD-10-CM | POA: Diagnosis not present

## 2017-10-04 DIAGNOSIS — Z1159 Encounter for screening for other viral diseases: Secondary | ICD-10-CM | POA: Diagnosis not present

## 2017-10-04 DIAGNOSIS — Z0000 Encounter for general adult medical examination without abnormal findings: Secondary | ICD-10-CM | POA: Diagnosis not present

## 2017-10-04 DIAGNOSIS — E611 Iron deficiency: Secondary | ICD-10-CM | POA: Diagnosis not present

## 2017-10-10 ENCOUNTER — Other Ambulatory Visit: Payer: Self-pay | Admitting: Obstetrics and Gynecology

## 2017-10-10 DIAGNOSIS — Z1231 Encounter for screening mammogram for malignant neoplasm of breast: Secondary | ICD-10-CM

## 2017-10-15 DIAGNOSIS — E042 Nontoxic multinodular goiter: Secondary | ICD-10-CM | POA: Diagnosis not present

## 2017-10-23 DIAGNOSIS — Z9189 Other specified personal risk factors, not elsewhere classified: Secondary | ICD-10-CM | POA: Diagnosis not present

## 2017-10-23 DIAGNOSIS — Z8619 Personal history of other infectious and parasitic diseases: Secondary | ICD-10-CM | POA: Diagnosis not present

## 2017-10-23 DIAGNOSIS — Z9010 Acquired absence of unspecified breast and nipple: Secondary | ICD-10-CM | POA: Diagnosis not present

## 2017-10-23 DIAGNOSIS — M25512 Pain in left shoulder: Secondary | ICD-10-CM | POA: Diagnosis not present

## 2017-11-07 DIAGNOSIS — E119 Type 2 diabetes mellitus without complications: Secondary | ICD-10-CM | POA: Diagnosis not present

## 2017-11-14 DIAGNOSIS — Z7984 Long term (current) use of oral hypoglycemic drugs: Secondary | ICD-10-CM | POA: Diagnosis not present

## 2017-11-14 DIAGNOSIS — E119 Type 2 diabetes mellitus without complications: Secondary | ICD-10-CM | POA: Diagnosis not present

## 2017-11-18 DIAGNOSIS — E119 Type 2 diabetes mellitus without complications: Secondary | ICD-10-CM | POA: Diagnosis not present

## 2017-11-18 DIAGNOSIS — Z794 Long term (current) use of insulin: Secondary | ICD-10-CM | POA: Diagnosis not present

## 2017-11-18 DIAGNOSIS — I10 Essential (primary) hypertension: Secondary | ICD-10-CM | POA: Diagnosis not present

## 2017-11-22 ENCOUNTER — Ambulatory Visit
Admission: RE | Admit: 2017-11-22 | Discharge: 2017-11-22 | Disposition: A | Discharge status: Home / Self Care | Payer: Medicare Other | Source: Ambulatory Visit | Attending: Obstetrics and Gynecology | Admitting: Obstetrics and Gynecology

## 2017-11-22 DIAGNOSIS — Z1231 Encounter for screening mammogram for malignant neoplasm of breast: Secondary | ICD-10-CM

## 2017-11-27 DIAGNOSIS — M67912 Unspecified disorder of synovium and tendon, left shoulder: Secondary | ICD-10-CM | POA: Diagnosis not present

## 2017-12-04 DIAGNOSIS — M25512 Pain in left shoulder: Secondary | ICD-10-CM | POA: Diagnosis not present

## 2017-12-04 DIAGNOSIS — M67912 Unspecified disorder of synovium and tendon, left shoulder: Secondary | ICD-10-CM | POA: Diagnosis not present

## 2017-12-10 DIAGNOSIS — M67912 Unspecified disorder of synovium and tendon, left shoulder: Secondary | ICD-10-CM | POA: Diagnosis not present

## 2017-12-10 DIAGNOSIS — M25512 Pain in left shoulder: Secondary | ICD-10-CM | POA: Diagnosis not present

## 2017-12-13 DIAGNOSIS — M25512 Pain in left shoulder: Secondary | ICD-10-CM | POA: Diagnosis not present

## 2017-12-13 DIAGNOSIS — M67912 Unspecified disorder of synovium and tendon, left shoulder: Secondary | ICD-10-CM | POA: Diagnosis not present

## 2017-12-17 DIAGNOSIS — M67912 Unspecified disorder of synovium and tendon, left shoulder: Secondary | ICD-10-CM | POA: Diagnosis not present

## 2017-12-17 DIAGNOSIS — M25512 Pain in left shoulder: Secondary | ICD-10-CM | POA: Diagnosis not present

## 2017-12-20 DIAGNOSIS — M67912 Unspecified disorder of synovium and tendon, left shoulder: Secondary | ICD-10-CM | POA: Diagnosis not present

## 2017-12-20 DIAGNOSIS — M25512 Pain in left shoulder: Secondary | ICD-10-CM | POA: Diagnosis not present

## 2017-12-24 DIAGNOSIS — M67912 Unspecified disorder of synovium and tendon, left shoulder: Secondary | ICD-10-CM | POA: Diagnosis not present

## 2017-12-24 DIAGNOSIS — M25512 Pain in left shoulder: Secondary | ICD-10-CM | POA: Diagnosis not present

## 2017-12-26 DIAGNOSIS — M25512 Pain in left shoulder: Secondary | ICD-10-CM | POA: Diagnosis not present

## 2017-12-26 DIAGNOSIS — M67912 Unspecified disorder of synovium and tendon, left shoulder: Secondary | ICD-10-CM | POA: Diagnosis not present

## 2017-12-31 ENCOUNTER — Emergency Department (HOSPITAL_COMMUNITY): Payer: Medicare Other

## 2017-12-31 ENCOUNTER — Encounter (HOSPITAL_COMMUNITY): Payer: Self-pay | Admitting: Emergency Medicine

## 2017-12-31 ENCOUNTER — Emergency Department (HOSPITAL_COMMUNITY)
Admission: EM | Admit: 2017-12-31 | Discharge: 2017-12-31 | Disposition: A | Discharge status: Home / Self Care | Payer: Medicare Other | Attending: Emergency Medicine | Admitting: Emergency Medicine

## 2017-12-31 DIAGNOSIS — Z79899 Other long term (current) drug therapy: Secondary | ICD-10-CM | POA: Insufficient documentation

## 2017-12-31 DIAGNOSIS — S20211A Contusion of right front wall of thorax, initial encounter: Secondary | ICD-10-CM | POA: Insufficient documentation

## 2017-12-31 DIAGNOSIS — E119 Type 2 diabetes mellitus without complications: Secondary | ICD-10-CM | POA: Diagnosis not present

## 2017-12-31 DIAGNOSIS — Y929 Unspecified place or not applicable: Secondary | ICD-10-CM | POA: Diagnosis not present

## 2017-12-31 DIAGNOSIS — S299XXA Unspecified injury of thorax, initial encounter: Secondary | ICD-10-CM | POA: Diagnosis present

## 2017-12-31 DIAGNOSIS — W01198A Fall on same level from slipping, tripping and stumbling with subsequent striking against other object, initial encounter: Secondary | ICD-10-CM | POA: Diagnosis not present

## 2017-12-31 DIAGNOSIS — Y9301 Activity, walking, marching and hiking: Secondary | ICD-10-CM | POA: Diagnosis not present

## 2017-12-31 DIAGNOSIS — I10 Essential (primary) hypertension: Secondary | ICD-10-CM | POA: Insufficient documentation

## 2017-12-31 DIAGNOSIS — Z794 Long term (current) use of insulin: Secondary | ICD-10-CM | POA: Diagnosis not present

## 2017-12-31 DIAGNOSIS — W19XXXA Unspecified fall, initial encounter: Secondary | ICD-10-CM

## 2017-12-31 DIAGNOSIS — Y999 Unspecified external cause status: Secondary | ICD-10-CM | POA: Diagnosis not present

## 2017-12-31 DIAGNOSIS — R0781 Pleurodynia: Secondary | ICD-10-CM | POA: Diagnosis not present

## 2017-12-31 NOTE — ED Triage Notes (Signed)
Pt states she tripped and fell on her porch this morning. She hit her right ribs on iron railing. Pt did not hit head or lose consciousness. Pt complains of right rib pain. Denies SOB.

## 2017-12-31 NOTE — ED Provider Notes (Signed)
[facility] EMERGENCY DEPARTMENT Provider Note   CSN: [id number] Arrival date & time: 12/31/17  1425     History   Chief Complaint Chief Complaint  Patient presents with  . Fall    HPI Shelby Morrow is a 72 y.o. female the past medical history of diabetes, hypertension,breast cancer currently in remission, who presents to ED for right rib pain after slipping and falling onto an iron railing just prior to arrival. She slipped on a rubber mat on the floor. She has taken ibuprofen that she takes when necessary for her chronic left shoulder pain due to rotator cuff injury with some improvement in her symptoms. Denies any head injuries, falls, blood thinner use, vomiting, vision changes, shortness of breath, chest pain, cough.  HPI  Past Medical History:  Diagnosis Date  . Acid reflux   . Anemia   . Anxiety   . Cancer ([place]) 2001   Breast DCIS  . Deafness in right ear 08/26/2013  . Depression    hx of .  . Diabetes mellitus   . Goiter   . H/O hiatal hernia   . Hyperlipidemia   . Hypertension   . Overactive bladder   . Positive TB test     Patient Active Problem List   Diagnosis Date Noted  . Multinodular goiter (nontoxic) 05/18/2013  . Thyroglossal duct cyst 05/18/2013    Past Surgical History:  Procedure Laterality Date  . ABDOMINAL HYSTERECTOMY  2006  . ADRENALECTOMY Right 1977  . birth mark Right    ear, 5 surgeries  . BREAST LUMPECTOMY Right 2001  . BREAST SURGERY Right 2005,2001  . CARDIAC CATHETERIZATION  2004  . DILATION AND CURETTAGE OF UTERUS  1980s  . GANGLION CYST EXCISION Right 1950s  . HEMANGIOMA EXCISION  2007   left nare  . MASTECTOMY Right 2001  . REDUCTION MAMMAPLASTY Left   . THYROGLOSSAL DUCT CYST N/A 08/26/2013   Procedure: EXCISIONAL THYROGLOSSAL DUCT CYST;  Surgeon: Jerrell Belfast, MD;  Location: [facility];  Service: ENT;  Laterality: N/A;  . THYROID CYST EXCISION  08/26/2013  . tummy tuck  2004  . TYMPANOPLASTY   1965     OB History   None      Home Medications    Prior to Admission medications   Medication Sig Start Date End Date Taking? Authorizing Provider  amLODipine (NORVASC) 5 MG tablet Take 5 mg by mouth daily.    [provider]  atenolol (TENORMIN) 25 MG tablet Take 25 mg by mouth 2 (two) times daily.     [provider]  Cholecalciferol (VITAMIN D) 2000 UNITS CAPS Take 2,000 Units by mouth daily.    [provider]  ferrous sulfate 325 (65 FE) MG tablet Take 325 mg by mouth daily with breakfast.    [provider]  insulin glargine (LANTUS) 100 UNIT/ML injection Inject 44 Units into the skin at bedtime. *per blood sugars 130=30 units, above 130=36 units    [provider]  metFORMIN (GLUCOPHAGE-XR) 500 MG 24 hr tablet Take 1,000 mg by mouth 2 (two) times daily.    [provider]  oxybutynin (DITROPAN XL) 15 MG 24 hr tablet Take 15 mg by mouth daily.      [provider]  pantoprazole (PROTONIX) 40 MG tablet Take 40 mg by mouth daily.  05/08/13   [provider]  potassium chloride (MICRO-K) 10 MEQ CR capsule Take 10 mEq by mouth daily.  05/08/13   [provider]  rosuvastatin (CRESTOR) 10 MG tablet Take 10 mg by mouth daily.      [provider]  VICTOZA 18 MG/3ML SOPN Inject 1.2 mg into the skin daily.  05/08/13   [provider]  vitamin B-12 (CYANOCOBALAMIN) 1000 MCG tablet Take 1,000 mcg by mouth daily.    [provider]    Family History Family History  Problem Relation Age of Onset  . Cancer Maternal Grandmother        breast  . Breast cancer Maternal Grandmother     Social History Social History   Tobacco Use  . Smoking status: Never Smoker  . Smokeless tobacco: Never Used  Substance Use Topics  . Alcohol use: Yes    Comment: occ  . Drug use: No     Allergies   Morphine and related; Adhesive [tape]; Amoxapine and related; Demerol [meperidine]; and Sulfa  antibiotics   Review of Systems Review of Systems  Constitutional: Negative for chills and fever.  Eyes: Negative for photophobia and visual disturbance.  Gastrointestinal: Negative for nausea and vomiting.  Musculoskeletal: Positive for arthralgias. Negative for back pain, gait problem, joint swelling and neck stiffness.  Skin: Negative for wound.  Neurological: Negative for syncope and weakness.     Physical Exam Updated Vital Signs BP (!) 148/87   Pulse 82   Temp 98.2 F (36.8 C) (Oral)   Resp 15   Ht 5\' 4"  (1.626 m)   Wt 86.2 kg (190 lb)   SpO2 100%   BMI 32.61 kg/m   Physical Exam  Constitutional: She appears well-developed and well-nourished. No distress.  Nontoxic appearing and in no acute distress. Speaking in sentences without difficulty.  HENT:  Head: Normocephalic and atraumatic.  Eyes: Conjunctivae and EOM are normal. No scleral icterus.  Neck: Normal range of motion.  Cardiovascular: Normal rate, regular rhythm and normal heart sounds.  Pulmonary/Chest: Effort normal and breath sounds normal. No respiratory distress. She exhibits tenderness.    Neurological: She is alert.  Skin: No rash noted. She is not diaphoretic.  Psychiatric: She has a normal mood and affect.  Nursing note and vitals reviewed.    ED Treatments / Results  Labs (all labs ordered are listed, but only abnormal results are displayed) Labs Reviewed - No data to display  EKG None  Radiology Dg Chest 2 View  Result Date: 12/31/2017 CLINICAL DATA:  Right rib pain after a fall. History of breast cancer. Initial encounter. EXAM: CHEST - 2 VIEW COMPARISON:  08/14/2013 FINDINGS: The cardiomediastinal silhouette is within normal limits. No airspace consolidation, edema, pleural effusion, or pneumothorax is identified. Surgical clips are present in the right upper abdomen. No acute osseous abnormality is identified. IMPRESSION: No active cardiopulmonary disease. Electronically Signed   By:  Logan Bores M.D.   On: 12/31/2017 15:53    Procedures Procedures (including critical care time)  Medications Ordered in ED Medications - No data to display   Initial Impression / Assessment and Plan / ED Course  I have reviewed the triage vital signs and the nursing notes.  Pertinent labs & imaging results that were available during my care of the patient were reviewed by me and considered in my medical decision making (see chart for details).     Patient presents to ED for evaluation of right rib pain after slipping and falling onto an iron railing prior to arrival. She denies any head injury, loss of consciousness, but can reuse, vomiting, changes in gait.On physical exam  she does have some tenderness to palpation of the right side of the ribs but has normal breath sounds bilaterally, normal respiratory effort. She denies any other complaints at this time. X-rays interpreted as negative. Suspect her symptoms are due to contusion. Advised patient to complete heat therapy, alternating Tylenol and ibuprofen for pain and to follow-up with her PCP for further evaluation. Patient is agreeable with this plan. Patient appears stable for discharge at this time. Should return precautions given.  Portions of this note were generated with [employer]. Dictation errors may occur despite best attempts at proofreading.   Final Clinical Impressions(s) / ED Diagnoses   Final diagnoses:  Fall, initial encounter  Contusion of rib on right side, initial encounter    ED Discharge Orders    None       Delia Heady, PA-C 12/31/17 1737    Little, Wenda Overland, MD 12/31/17 [phone]

## 2018-01-01 DIAGNOSIS — E119 Type 2 diabetes mellitus without complications: Secondary | ICD-10-CM | POA: Diagnosis not present

## 2018-01-01 DIAGNOSIS — Z7984 Long term (current) use of oral hypoglycemic drugs: Secondary | ICD-10-CM | POA: Diagnosis not present

## 2018-01-01 DIAGNOSIS — I10 Essential (primary) hypertension: Secondary | ICD-10-CM | POA: Diagnosis not present

## 2018-01-02 DIAGNOSIS — S20219A Contusion of unspecified front wall of thorax, initial encounter: Secondary | ICD-10-CM | POA: Diagnosis not present

## 2018-02-05 DIAGNOSIS — L821 Other seborrheic keratosis: Secondary | ICD-10-CM | POA: Diagnosis not present

## 2018-02-05 DIAGNOSIS — M25571 Pain in right ankle and joints of right foot: Secondary | ICD-10-CM | POA: Diagnosis not present

## 2018-02-05 DIAGNOSIS — D1801 Hemangioma of skin and subcutaneous tissue: Secondary | ICD-10-CM | POA: Diagnosis not present

## 2018-02-05 DIAGNOSIS — L570 Actinic keratosis: Secondary | ICD-10-CM | POA: Diagnosis not present

## 2018-02-05 DIAGNOSIS — D2239 Melanocytic nevi of other parts of face: Secondary | ICD-10-CM | POA: Diagnosis not present

## 2018-02-05 DIAGNOSIS — L820 Inflamed seborrheic keratosis: Secondary | ICD-10-CM | POA: Diagnosis not present

## 2018-02-05 DIAGNOSIS — M76821 Posterior tibial tendinitis, right leg: Secondary | ICD-10-CM | POA: Diagnosis not present

## 2018-02-05 DIAGNOSIS — L814 Other melanin hyperpigmentation: Secondary | ICD-10-CM | POA: Diagnosis not present

## 2018-02-18 DIAGNOSIS — E538 Deficiency of other specified B group vitamins: Secondary | ICD-10-CM | POA: Diagnosis not present

## 2018-02-18 DIAGNOSIS — E669 Obesity, unspecified: Secondary | ICD-10-CM | POA: Diagnosis not present

## 2018-02-18 DIAGNOSIS — N3281 Overactive bladder: Secondary | ICD-10-CM | POA: Diagnosis not present

## 2018-02-18 DIAGNOSIS — I10 Essential (primary) hypertension: Secondary | ICD-10-CM | POA: Diagnosis not present

## 2018-02-18 DIAGNOSIS — E1169 Type 2 diabetes mellitus with other specified complication: Secondary | ICD-10-CM | POA: Diagnosis not present

## 2018-02-18 DIAGNOSIS — E1165 Type 2 diabetes mellitus with hyperglycemia: Secondary | ICD-10-CM | POA: Diagnosis not present

## 2018-02-18 DIAGNOSIS — K219 Gastro-esophageal reflux disease without esophagitis: Secondary | ICD-10-CM | POA: Diagnosis not present

## 2018-03-03 DIAGNOSIS — M25571 Pain in right ankle and joints of right foot: Secondary | ICD-10-CM | POA: Diagnosis not present

## 2018-03-10 DIAGNOSIS — M25571 Pain in right ankle and joints of right foot: Secondary | ICD-10-CM | POA: Diagnosis not present

## 2018-03-10 DIAGNOSIS — S86111D Strain of other muscle(s) and tendon(s) of posterior muscle group at lower leg level, right leg, subsequent encounter: Secondary | ICD-10-CM | POA: Diagnosis not present

## 2018-03-11 DIAGNOSIS — S86111D Strain of other muscle(s) and tendon(s) of posterior muscle group at lower leg level, right leg, subsequent encounter: Secondary | ICD-10-CM | POA: Diagnosis not present

## 2018-03-11 DIAGNOSIS — M25571 Pain in right ankle and joints of right foot: Secondary | ICD-10-CM | POA: Diagnosis not present

## 2018-03-17 DIAGNOSIS — M25571 Pain in right ankle and joints of right foot: Secondary | ICD-10-CM | POA: Diagnosis not present

## 2018-03-17 DIAGNOSIS — S86111D Strain of other muscle(s) and tendon(s) of posterior muscle group at lower leg level, right leg, subsequent encounter: Secondary | ICD-10-CM | POA: Diagnosis not present

## 2018-03-20 DIAGNOSIS — M25571 Pain in right ankle and joints of right foot: Secondary | ICD-10-CM | POA: Diagnosis not present

## 2018-03-20 DIAGNOSIS — S86111D Strain of other muscle(s) and tendon(s) of posterior muscle group at lower leg level, right leg, subsequent encounter: Secondary | ICD-10-CM | POA: Diagnosis not present

## 2018-03-25 DIAGNOSIS — S86111D Strain of other muscle(s) and tendon(s) of posterior muscle group at lower leg level, right leg, subsequent encounter: Secondary | ICD-10-CM | POA: Diagnosis not present

## 2018-03-25 DIAGNOSIS — M25571 Pain in right ankle and joints of right foot: Secondary | ICD-10-CM | POA: Diagnosis not present

## 2018-03-31 DIAGNOSIS — M25512 Pain in left shoulder: Secondary | ICD-10-CM | POA: Diagnosis not present

## 2018-03-31 DIAGNOSIS — M25571 Pain in right ankle and joints of right foot: Secondary | ICD-10-CM | POA: Diagnosis not present

## 2018-03-31 DIAGNOSIS — S86111D Strain of other muscle(s) and tendon(s) of posterior muscle group at lower leg level, right leg, subsequent encounter: Secondary | ICD-10-CM | POA: Diagnosis not present

## 2018-04-25 DIAGNOSIS — M67912 Unspecified disorder of synovium and tendon, left shoulder: Secondary | ICD-10-CM | POA: Diagnosis not present

## 2018-05-19 DIAGNOSIS — M25571 Pain in right ankle and joints of right foot: Secondary | ICD-10-CM | POA: Diagnosis not present

## 2018-05-19 DIAGNOSIS — M76821 Posterior tibial tendinitis, right leg: Secondary | ICD-10-CM | POA: Diagnosis not present

## 2018-05-21 ENCOUNTER — Other Ambulatory Visit: Payer: Self-pay | Admitting: Family Medicine

## 2018-05-21 DIAGNOSIS — M25512 Pain in left shoulder: Secondary | ICD-10-CM

## 2018-05-22 ENCOUNTER — Other Ambulatory Visit: Payer: Self-pay | Admitting: Family Medicine

## 2018-05-22 DIAGNOSIS — M25571 Pain in right ankle and joints of right foot: Secondary | ICD-10-CM

## 2018-06-01 ENCOUNTER — Ambulatory Visit
Admission: RE | Admit: 2018-06-01 | Discharge: 2018-06-01 | Disposition: A | Discharge status: Home / Self Care | Payer: Medicare Other | Source: Ambulatory Visit | Attending: Family Medicine | Admitting: Family Medicine

## 2018-06-01 DIAGNOSIS — M25571 Pain in right ankle and joints of right foot: Secondary | ICD-10-CM

## 2018-06-01 DIAGNOSIS — M79671 Pain in right foot: Secondary | ICD-10-CM | POA: Diagnosis not present

## 2018-06-01 DIAGNOSIS — M19071 Primary osteoarthritis, right ankle and foot: Secondary | ICD-10-CM | POA: Diagnosis not present

## 2018-06-04 DIAGNOSIS — M76821 Posterior tibial tendinitis, right leg: Secondary | ICD-10-CM | POA: Diagnosis not present

## 2018-06-16 DIAGNOSIS — I10 Essential (primary) hypertension: Secondary | ICD-10-CM | POA: Diagnosis not present

## 2018-06-16 DIAGNOSIS — Z794 Long term (current) use of insulin: Secondary | ICD-10-CM | POA: Diagnosis not present

## 2018-06-16 DIAGNOSIS — Z23 Encounter for immunization: Secondary | ICD-10-CM | POA: Diagnosis not present

## 2018-06-16 DIAGNOSIS — E1169 Type 2 diabetes mellitus with other specified complication: Secondary | ICD-10-CM | POA: Diagnosis not present

## 2018-06-16 DIAGNOSIS — M25571 Pain in right ankle and joints of right foot: Secondary | ICD-10-CM | POA: Diagnosis not present

## 2018-06-16 DIAGNOSIS — K219 Gastro-esophageal reflux disease without esophagitis: Secondary | ICD-10-CM | POA: Diagnosis not present

## 2018-07-03 DIAGNOSIS — M76821 Posterior tibial tendinitis, right leg: Secondary | ICD-10-CM | POA: Diagnosis not present

## 2018-07-10 DIAGNOSIS — M76821 Posterior tibial tendinitis, right leg: Secondary | ICD-10-CM | POA: Diagnosis not present

## 2018-07-31 DIAGNOSIS — M76821 Posterior tibial tendinitis, right leg: Secondary | ICD-10-CM | POA: Diagnosis not present

## 2018-10-01 DIAGNOSIS — H5203 Hypermetropia, bilateral: Secondary | ICD-10-CM | POA: Diagnosis not present

## 2018-10-01 DIAGNOSIS — H2513 Age-related nuclear cataract, bilateral: Secondary | ICD-10-CM | POA: Diagnosis not present

## 2018-10-01 DIAGNOSIS — E119 Type 2 diabetes mellitus without complications: Secondary | ICD-10-CM | POA: Diagnosis not present

## 2018-10-14 ENCOUNTER — Other Ambulatory Visit: Payer: Self-pay | Admitting: Obstetrics and Gynecology

## 2018-10-14 DIAGNOSIS — Z1231 Encounter for screening mammogram for malignant neoplasm of breast: Secondary | ICD-10-CM

## 2018-10-20 DIAGNOSIS — E1169 Type 2 diabetes mellitus with other specified complication: Secondary | ICD-10-CM | POA: Diagnosis not present

## 2018-10-20 DIAGNOSIS — Z0000 Encounter for general adult medical examination without abnormal findings: Secondary | ICD-10-CM | POA: Diagnosis not present

## 2018-10-20 DIAGNOSIS — I10 Essential (primary) hypertension: Secondary | ICD-10-CM | POA: Diagnosis not present

## 2018-10-20 DIAGNOSIS — Z794 Long term (current) use of insulin: Secondary | ICD-10-CM | POA: Diagnosis not present

## 2018-10-20 DIAGNOSIS — E669 Obesity, unspecified: Secondary | ICD-10-CM | POA: Diagnosis not present

## 2018-10-20 DIAGNOSIS — E538 Deficiency of other specified B group vitamins: Secondary | ICD-10-CM | POA: Diagnosis not present

## 2018-10-20 DIAGNOSIS — K219 Gastro-esophageal reflux disease without esophagitis: Secondary | ICD-10-CM | POA: Diagnosis not present

## 2018-10-20 DIAGNOSIS — N3281 Overactive bladder: Secondary | ICD-10-CM | POA: Diagnosis not present

## 2018-10-20 DIAGNOSIS — E042 Nontoxic multinodular goiter: Secondary | ICD-10-CM | POA: Diagnosis not present

## 2018-10-20 DIAGNOSIS — E7800 Pure hypercholesterolemia, unspecified: Secondary | ICD-10-CM | POA: Diagnosis not present

## 2018-10-20 DIAGNOSIS — Z1389 Encounter for screening for other disorder: Secondary | ICD-10-CM | POA: Diagnosis not present

## 2018-10-27 DIAGNOSIS — Z8619 Personal history of other infectious and parasitic diseases: Secondary | ICD-10-CM | POA: Diagnosis not present

## 2018-10-27 DIAGNOSIS — R197 Diarrhea, unspecified: Secondary | ICD-10-CM | POA: Diagnosis not present

## 2018-10-27 DIAGNOSIS — Z9189 Other specified personal risk factors, not elsewhere classified: Secondary | ICD-10-CM | POA: Diagnosis not present

## 2018-11-10 ENCOUNTER — Other Ambulatory Visit: Payer: Self-pay | Admitting: Surgery

## 2018-11-10 DIAGNOSIS — E042 Nontoxic multinodular goiter: Secondary | ICD-10-CM

## 2018-11-13 ENCOUNTER — Ambulatory Visit
Admission: RE | Admit: 2018-11-13 | Discharge: 2018-11-13 | Disposition: A | Discharge status: Home / Self Care | Payer: Medicare Other | Source: Ambulatory Visit | Attending: Surgery | Admitting: Surgery

## 2018-11-13 DIAGNOSIS — E042 Nontoxic multinodular goiter: Secondary | ICD-10-CM

## 2018-11-27 ENCOUNTER — Ambulatory Visit
Admission: RE | Admit: 2018-11-27 | Discharge: 2018-11-27 | Disposition: A | Discharge status: Home / Self Care | Payer: Medicare Other | Source: Ambulatory Visit | Attending: Obstetrics and Gynecology | Admitting: Obstetrics and Gynecology

## 2018-11-27 DIAGNOSIS — Z1231 Encounter for screening mammogram for malignant neoplasm of breast: Secondary | ICD-10-CM | POA: Diagnosis not present

## 2018-12-03 DIAGNOSIS — D485 Neoplasm of uncertain behavior of skin: Secondary | ICD-10-CM | POA: Diagnosis not present

## 2018-12-03 DIAGNOSIS — L720 Epidermal cyst: Secondary | ICD-10-CM | POA: Diagnosis not present

## 2018-12-03 DIAGNOSIS — L821 Other seborrheic keratosis: Secondary | ICD-10-CM | POA: Diagnosis not present

## 2018-12-03 DIAGNOSIS — L570 Actinic keratosis: Secondary | ICD-10-CM | POA: Diagnosis not present

## 2018-12-04 DIAGNOSIS — E1169 Type 2 diabetes mellitus with other specified complication: Secondary | ICD-10-CM | POA: Diagnosis not present

## 2018-12-04 DIAGNOSIS — I10 Essential (primary) hypertension: Secondary | ICD-10-CM | POA: Diagnosis not present

## 2018-12-04 DIAGNOSIS — Z794 Long term (current) use of insulin: Secondary | ICD-10-CM | POA: Diagnosis not present

## 2018-12-04 DIAGNOSIS — E119 Type 2 diabetes mellitus without complications: Secondary | ICD-10-CM | POA: Diagnosis not present

## 2018-12-08 DIAGNOSIS — J069 Acute upper respiratory infection, unspecified: Secondary | ICD-10-CM | POA: Diagnosis not present

## 2018-12-10 DIAGNOSIS — L570 Actinic keratosis: Secondary | ICD-10-CM | POA: Diagnosis not present

## 2019-01-28 DIAGNOSIS — D224 Melanocytic nevi of scalp and neck: Secondary | ICD-10-CM | POA: Diagnosis not present

## 2019-01-28 DIAGNOSIS — L821 Other seborrheic keratosis: Secondary | ICD-10-CM | POA: Diagnosis not present

## 2019-01-28 DIAGNOSIS — D2239 Melanocytic nevi of other parts of face: Secondary | ICD-10-CM | POA: Diagnosis not present

## 2019-01-28 DIAGNOSIS — R21 Rash and other nonspecific skin eruption: Secondary | ICD-10-CM | POA: Diagnosis not present

## 2019-01-28 DIAGNOSIS — L820 Inflamed seborrheic keratosis: Secondary | ICD-10-CM | POA: Diagnosis not present

## 2019-01-28 DIAGNOSIS — E042 Nontoxic multinodular goiter: Secondary | ICD-10-CM | POA: Diagnosis not present

## 2019-01-28 DIAGNOSIS — D1801 Hemangioma of skin and subcutaneous tissue: Secondary | ICD-10-CM | POA: Diagnosis not present

## 2019-01-28 DIAGNOSIS — L570 Actinic keratosis: Secondary | ICD-10-CM | POA: Diagnosis not present

## 2019-02-06 DIAGNOSIS — E1169 Type 2 diabetes mellitus with other specified complication: Secondary | ICD-10-CM | POA: Diagnosis not present

## 2019-02-06 DIAGNOSIS — E119 Type 2 diabetes mellitus without complications: Secondary | ICD-10-CM | POA: Diagnosis not present

## 2019-02-06 DIAGNOSIS — Z7984 Long term (current) use of oral hypoglycemic drugs: Secondary | ICD-10-CM | POA: Diagnosis not present

## 2019-02-06 DIAGNOSIS — I10 Essential (primary) hypertension: Secondary | ICD-10-CM | POA: Diagnosis not present

## 2019-02-10 DIAGNOSIS — L821 Other seborrheic keratosis: Secondary | ICD-10-CM | POA: Diagnosis not present

## 2019-02-10 DIAGNOSIS — L308 Other specified dermatitis: Secondary | ICD-10-CM | POA: Diagnosis not present

## 2019-02-19 DIAGNOSIS — E538 Deficiency of other specified B group vitamins: Secondary | ICD-10-CM | POA: Diagnosis not present

## 2019-02-19 DIAGNOSIS — K219 Gastro-esophageal reflux disease without esophagitis: Secondary | ICD-10-CM | POA: Diagnosis not present

## 2019-02-19 DIAGNOSIS — Z794 Long term (current) use of insulin: Secondary | ICD-10-CM | POA: Diagnosis not present

## 2019-02-19 DIAGNOSIS — K5900 Constipation, unspecified: Secondary | ICD-10-CM | POA: Diagnosis not present

## 2019-02-19 DIAGNOSIS — E1169 Type 2 diabetes mellitus with other specified complication: Secondary | ICD-10-CM | POA: Diagnosis not present

## 2019-02-19 DIAGNOSIS — Z8639 Personal history of other endocrine, nutritional and metabolic disease: Secondary | ICD-10-CM | POA: Diagnosis not present

## 2019-02-19 DIAGNOSIS — I10 Essential (primary) hypertension: Secondary | ICD-10-CM | POA: Diagnosis not present

## 2019-02-23 DIAGNOSIS — Z8639 Personal history of other endocrine, nutritional and metabolic disease: Secondary | ICD-10-CM | POA: Diagnosis not present

## 2019-02-23 DIAGNOSIS — E1169 Type 2 diabetes mellitus with other specified complication: Secondary | ICD-10-CM | POA: Diagnosis not present

## 2019-02-23 DIAGNOSIS — E538 Deficiency of other specified B group vitamins: Secondary | ICD-10-CM | POA: Diagnosis not present

## 2019-02-23 DIAGNOSIS — Z7984 Long term (current) use of oral hypoglycemic drugs: Secondary | ICD-10-CM | POA: Diagnosis not present

## 2019-03-05 DIAGNOSIS — I10 Essential (primary) hypertension: Secondary | ICD-10-CM | POA: Diagnosis not present

## 2019-03-05 DIAGNOSIS — E1169 Type 2 diabetes mellitus with other specified complication: Secondary | ICD-10-CM | POA: Diagnosis not present

## 2019-03-05 DIAGNOSIS — E119 Type 2 diabetes mellitus without complications: Secondary | ICD-10-CM | POA: Diagnosis not present

## 2019-04-15 DIAGNOSIS — L820 Inflamed seborrheic keratosis: Secondary | ICD-10-CM | POA: Diagnosis not present

## 2019-04-15 DIAGNOSIS — L570 Actinic keratosis: Secondary | ICD-10-CM | POA: Diagnosis not present

## 2019-04-15 DIAGNOSIS — L438 Other lichen planus: Secondary | ICD-10-CM | POA: Diagnosis not present

## 2019-04-15 DIAGNOSIS — L308 Other specified dermatitis: Secondary | ICD-10-CM | POA: Diagnosis not present

## 2019-04-15 DIAGNOSIS — L280 Lichen simplex chronicus: Secondary | ICD-10-CM | POA: Diagnosis not present

## 2019-04-17 DIAGNOSIS — E1169 Type 2 diabetes mellitus with other specified complication: Secondary | ICD-10-CM | POA: Diagnosis not present

## 2019-04-17 DIAGNOSIS — I10 Essential (primary) hypertension: Secondary | ICD-10-CM | POA: Diagnosis not present

## 2019-04-17 DIAGNOSIS — E119 Type 2 diabetes mellitus without complications: Secondary | ICD-10-CM | POA: Diagnosis not present

## 2019-04-17 DIAGNOSIS — Z7984 Long term (current) use of oral hypoglycemic drugs: Secondary | ICD-10-CM | POA: Diagnosis not present

## 2019-04-26 IMAGING — MG DIGITAL SCREENING BILATERAL MAMMOGRAM WITH IMPLANTS, CAD AND TOM
6 of 10 series · 6 of 26 positions shown · non-contrast
Comparison: Previous exam(s).

CLINICAL DATA: Screening.

EXAM:
DIGITAL SCREENING BILATERAL MAMMOGRAM WITH IMPLANTS, CAD AND TOMO

[R MLO]
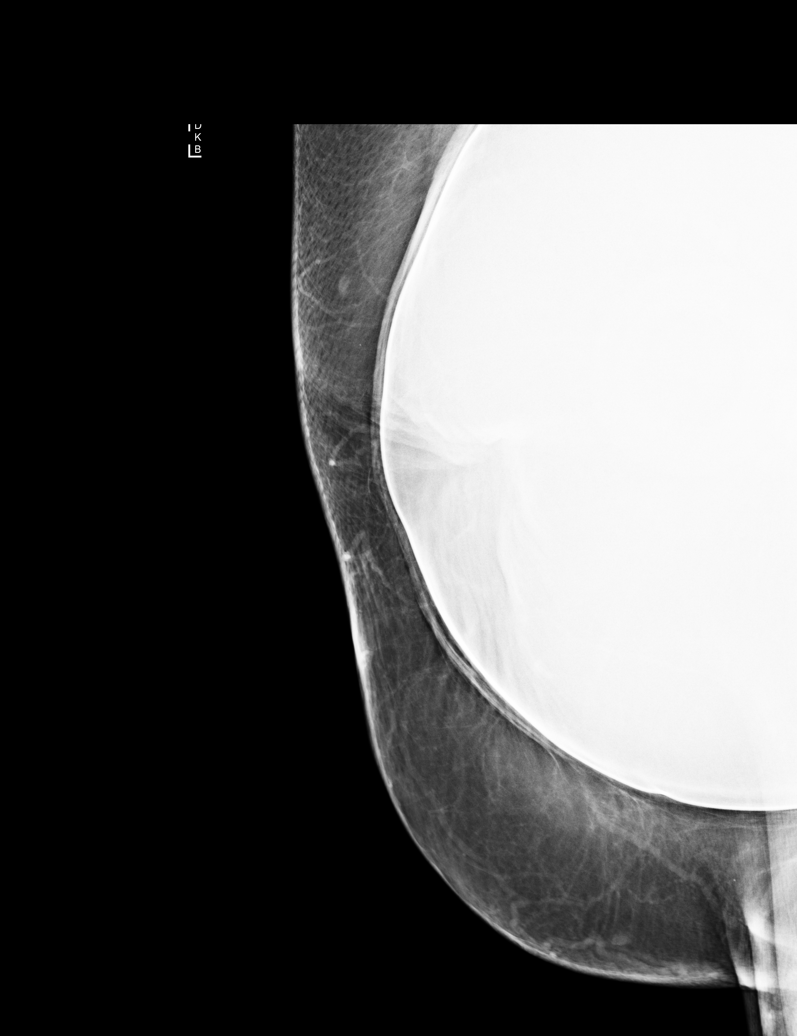

[R CC]
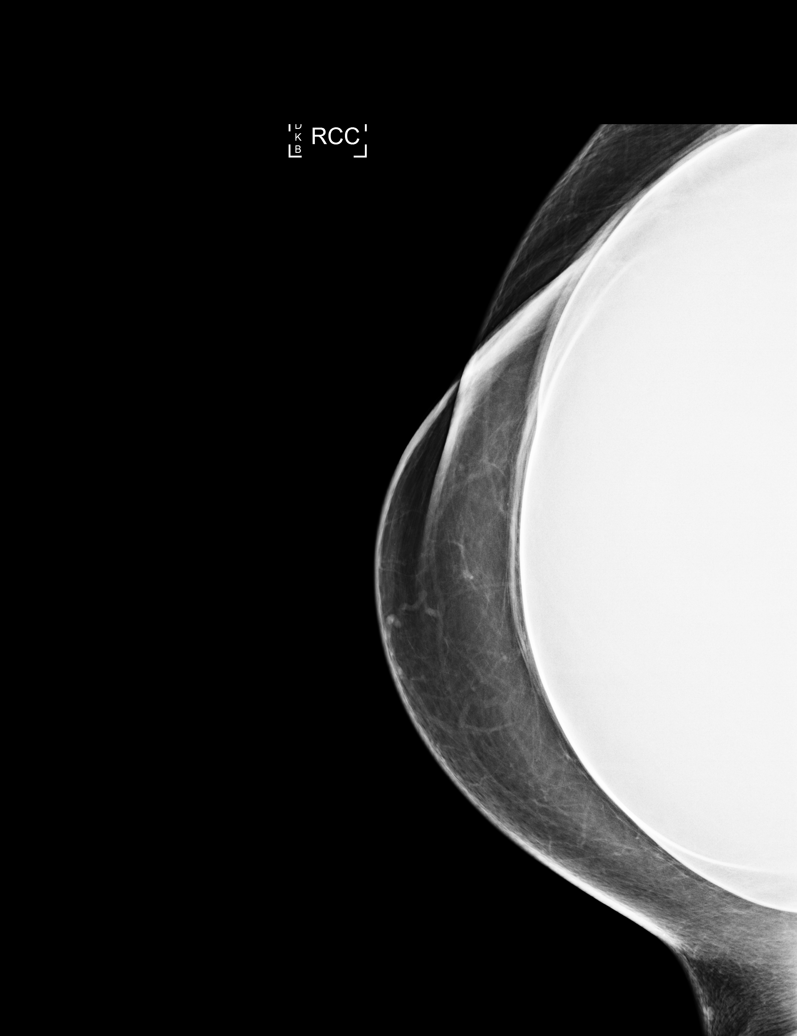

[R CC synth-2D]
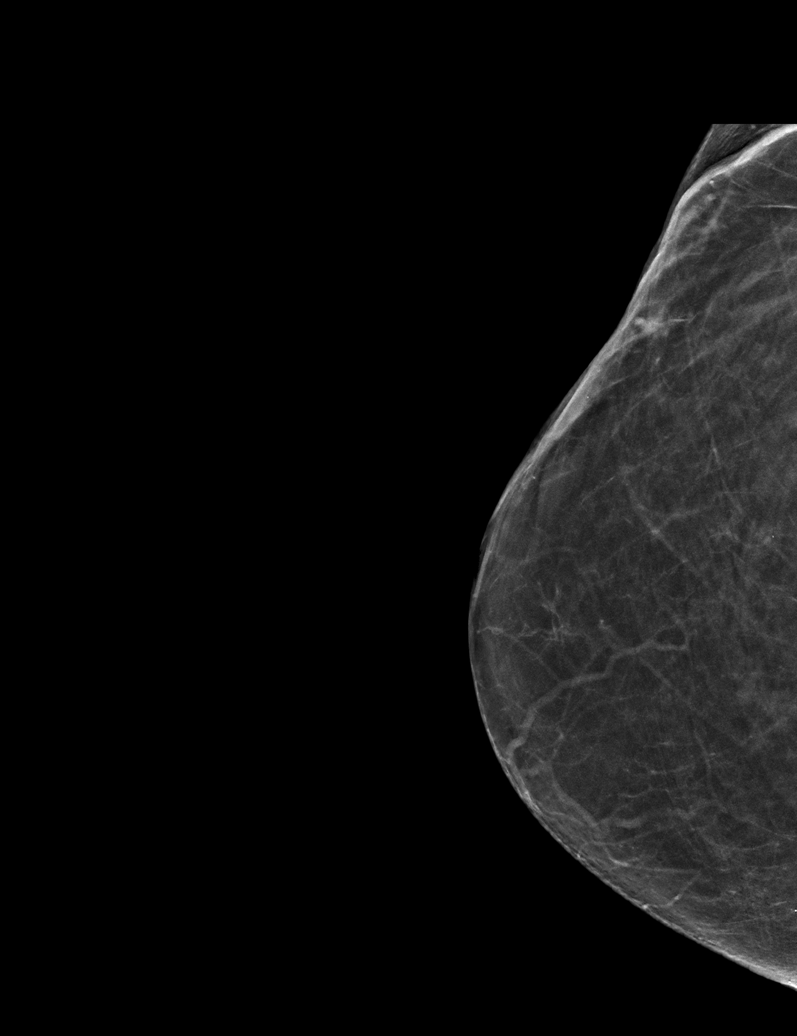

[R MLO synth-2D]
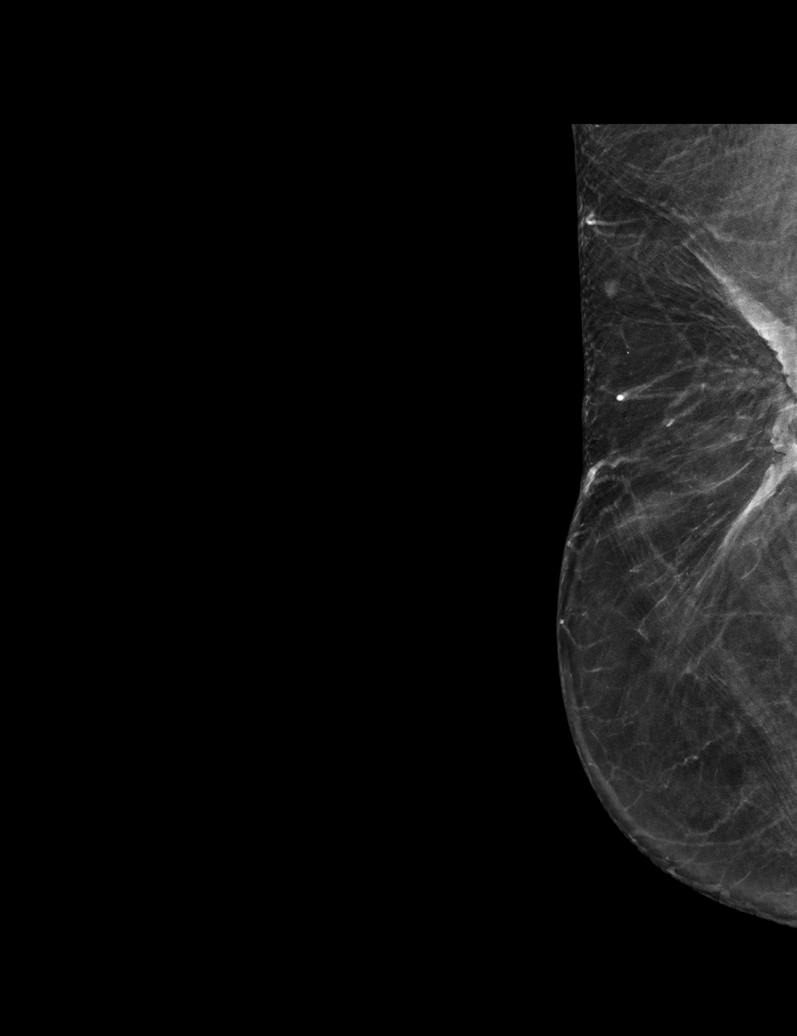

[L CC synth-2D]
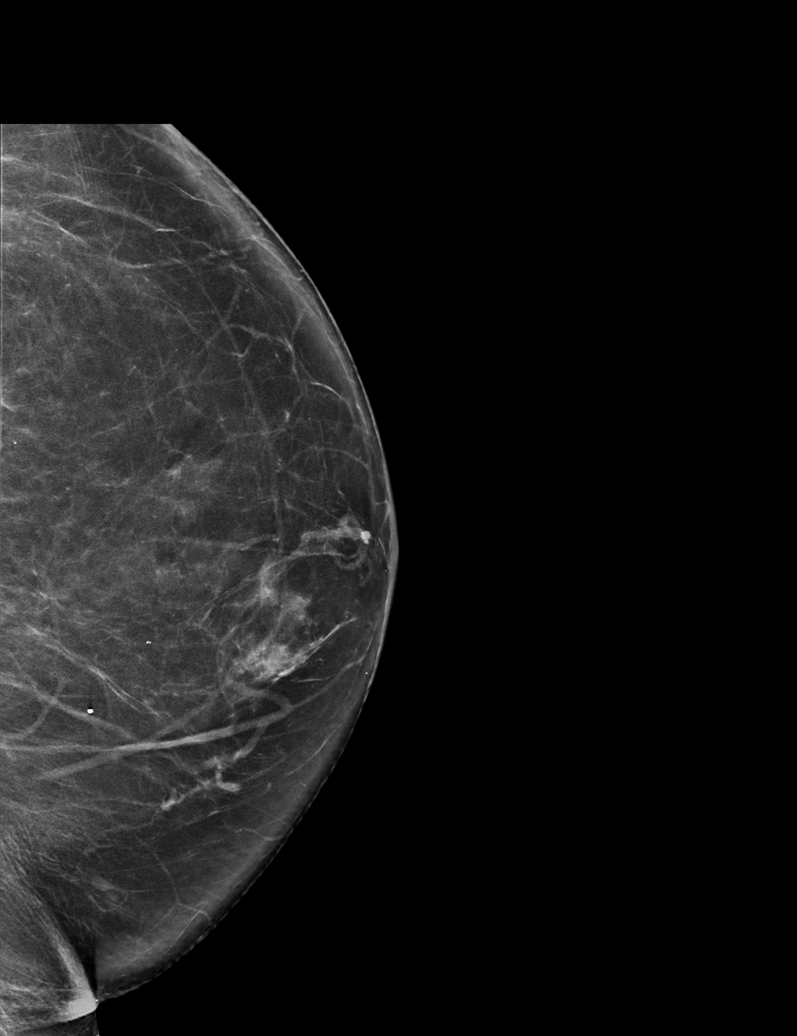

[L MLO synth-2D]
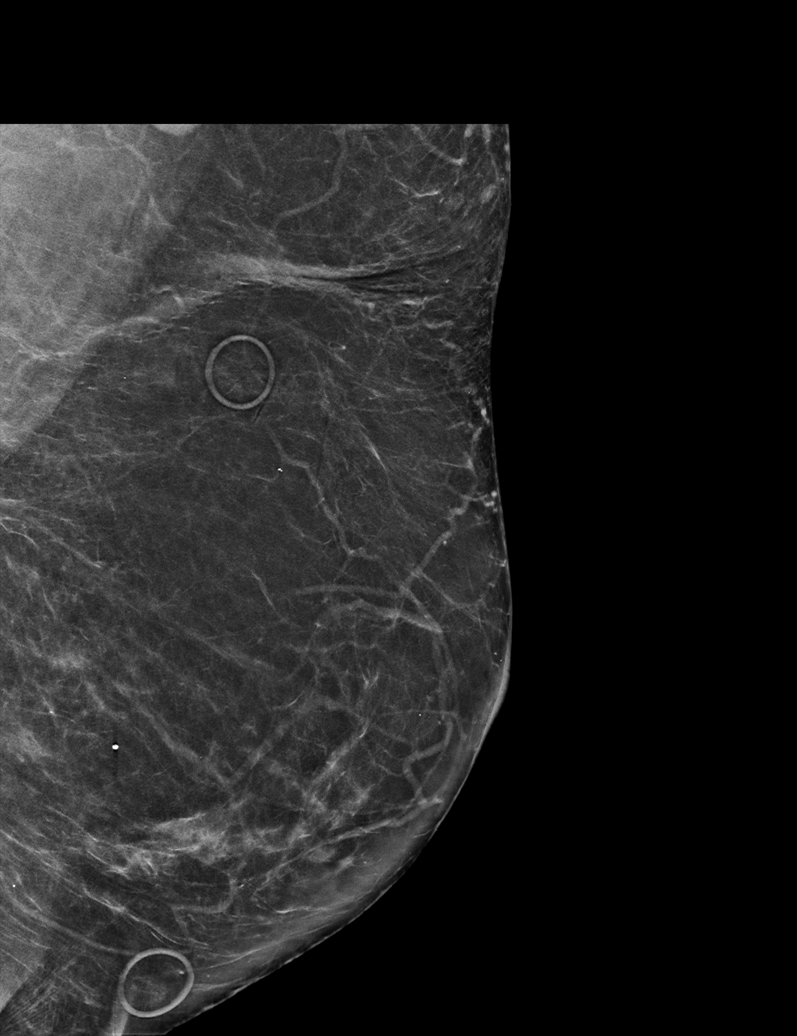

[6 of 26 positions shown; findings below may reference images not displayed]

ACR Breast Density Category b: There are scattered areas of
fibroglandular density.
FINDINGS: There are no findings suspicious for malignancy. Prior right
mastectomy with right retropectoral implant in place. Images were
processed with CAD.
IMPRESSION: No mammographic evidence of malignancy. A result letter of this
screening mammogram will be mailed directly to the patient.

RECOMMENDATION:
Screening mammogram in one year. (Code:[id number]-S-O8W)

BI-RADS CATEGORY  1: Negative.

## 2019-05-06 DIAGNOSIS — L821 Other seborrheic keratosis: Secondary | ICD-10-CM | POA: Diagnosis not present

## 2019-05-06 DIAGNOSIS — L820 Inflamed seborrheic keratosis: Secondary | ICD-10-CM | POA: Diagnosis not present

## 2019-05-06 DIAGNOSIS — L570 Actinic keratosis: Secondary | ICD-10-CM | POA: Diagnosis not present

## 2019-05-06 DIAGNOSIS — L433 Subacute (active) lichen planus: Secondary | ICD-10-CM | POA: Diagnosis not present

## 2019-06-16 DIAGNOSIS — Z23 Encounter for immunization: Secondary | ICD-10-CM | POA: Diagnosis not present

## 2019-06-16 DIAGNOSIS — R21 Rash and other nonspecific skin eruption: Secondary | ICD-10-CM | POA: Diagnosis not present

## 2019-06-16 DIAGNOSIS — E538 Deficiency of other specified B group vitamins: Secondary | ICD-10-CM | POA: Diagnosis not present

## 2019-06-16 DIAGNOSIS — I10 Essential (primary) hypertension: Secondary | ICD-10-CM | POA: Diagnosis not present

## 2019-06-16 DIAGNOSIS — K219 Gastro-esophageal reflux disease without esophagitis: Secondary | ICD-10-CM | POA: Diagnosis not present

## 2019-06-16 DIAGNOSIS — Z8639 Personal history of other endocrine, nutritional and metabolic disease: Secondary | ICD-10-CM | POA: Diagnosis not present

## 2019-06-16 DIAGNOSIS — E1169 Type 2 diabetes mellitus with other specified complication: Secondary | ICD-10-CM | POA: Diagnosis not present

## 2019-06-16 DIAGNOSIS — Z794 Long term (current) use of insulin: Secondary | ICD-10-CM | POA: Diagnosis not present

## 2019-07-08 DIAGNOSIS — E7800 Pure hypercholesterolemia, unspecified: Secondary | ICD-10-CM | POA: Diagnosis not present

## 2019-07-08 DIAGNOSIS — E119 Type 2 diabetes mellitus without complications: Secondary | ICD-10-CM | POA: Diagnosis not present

## 2019-07-08 DIAGNOSIS — E1169 Type 2 diabetes mellitus with other specified complication: Secondary | ICD-10-CM | POA: Diagnosis not present

## 2019-07-08 DIAGNOSIS — I10 Essential (primary) hypertension: Secondary | ICD-10-CM | POA: Diagnosis not present

## 2019-07-29 DIAGNOSIS — K219 Gastro-esophageal reflux disease without esophagitis: Secondary | ICD-10-CM | POA: Diagnosis not present

## 2019-07-29 DIAGNOSIS — L290 Pruritus ani: Secondary | ICD-10-CM | POA: Diagnosis not present

## 2019-07-29 DIAGNOSIS — Z86010 Personal history of colonic polyps: Secondary | ICD-10-CM | POA: Diagnosis not present

## 2019-09-30 DIAGNOSIS — M542 Cervicalgia: Secondary | ICD-10-CM | POA: Diagnosis not present

## 2019-10-22 DIAGNOSIS — L290 Pruritus ani: Secondary | ICD-10-CM | POA: Diagnosis not present

## 2019-10-22 DIAGNOSIS — K219 Gastro-esophageal reflux disease without esophagitis: Secondary | ICD-10-CM | POA: Diagnosis not present

## 2019-10-22 DIAGNOSIS — Z86010 Personal history of colonic polyps: Secondary | ICD-10-CM | POA: Diagnosis not present

## 2019-10-25 ENCOUNTER — Ambulatory Visit: Payer: Medicare Other

## 2019-10-26 DIAGNOSIS — E7800 Pure hypercholesterolemia, unspecified: Secondary | ICD-10-CM | POA: Diagnosis not present

## 2019-10-26 DIAGNOSIS — Z1389 Encounter for screening for other disorder: Secondary | ICD-10-CM | POA: Diagnosis not present

## 2019-10-26 DIAGNOSIS — H9191 Unspecified hearing loss, right ear: Secondary | ICD-10-CM | POA: Diagnosis not present

## 2019-10-26 DIAGNOSIS — E1169 Type 2 diabetes mellitus with other specified complication: Secondary | ICD-10-CM | POA: Diagnosis not present

## 2019-10-26 DIAGNOSIS — Z0000 Encounter for general adult medical examination without abnormal findings: Secondary | ICD-10-CM | POA: Diagnosis not present

## 2019-10-26 DIAGNOSIS — I10 Essential (primary) hypertension: Secondary | ICD-10-CM | POA: Diagnosis not present

## 2019-10-26 DIAGNOSIS — E042 Nontoxic multinodular goiter: Secondary | ICD-10-CM | POA: Diagnosis not present

## 2019-10-26 DIAGNOSIS — K219 Gastro-esophageal reflux disease without esophagitis: Secondary | ICD-10-CM | POA: Diagnosis not present

## 2019-10-26 DIAGNOSIS — Z794 Long term (current) use of insulin: Secondary | ICD-10-CM | POA: Diagnosis not present

## 2019-10-26 DIAGNOSIS — N3281 Overactive bladder: Secondary | ICD-10-CM | POA: Diagnosis not present

## 2019-10-29 DIAGNOSIS — D1801 Hemangioma of skin and subcutaneous tissue: Secondary | ICD-10-CM | POA: Diagnosis not present

## 2019-10-29 DIAGNOSIS — L821 Other seborrheic keratosis: Secondary | ICD-10-CM | POA: Diagnosis not present

## 2019-10-29 DIAGNOSIS — L433 Subacute (active) lichen planus: Secondary | ICD-10-CM | POA: Diagnosis not present

## 2019-10-29 DIAGNOSIS — L820 Inflamed seborrheic keratosis: Secondary | ICD-10-CM | POA: Diagnosis not present

## 2019-10-31 ENCOUNTER — Ambulatory Visit: Payer: Medicare Other | Attending: Internal Medicine

## 2019-10-31 DIAGNOSIS — Z23 Encounter for immunization: Secondary | ICD-10-CM | POA: Insufficient documentation

## 2019-10-31 NOTE — Progress Notes (Signed)
   Covid-19 Vaccination Clinic  Name:  Shelby Morrow    MRN: [id number] DOB: 28-Dec-1945  10/31/2019  Shelby Morrow was observed post Covid-19 immunization for 15 minutes without incidence. She was provided with Vaccine Information Sheet and instruction to access the V-Safe system.   Shelby Morrow was instructed to call 911 with any severe reactions post vaccine: Marland Kitchen Difficulty breathing  . Swelling of your face and throat  . A fast heartbeat  . A bad rash all over your body  . Dizziness and weakness    Immunizations Administered    Name Date Dose VIS Date Route   Pfizer COVID-19 Vaccine 10/31/2019  5:32 PM 0.3 mL 09/04/2019 Intramuscular   Manufacturer: [place]   Lot: [id number]   [place]: [id number]

## 2019-11-02 DIAGNOSIS — N898 Other specified noninflammatory disorders of vagina: Secondary | ICD-10-CM | POA: Diagnosis not present

## 2019-11-02 DIAGNOSIS — Z9189 Other specified personal risk factors, not elsewhere classified: Secondary | ICD-10-CM | POA: Diagnosis not present

## 2019-11-02 DIAGNOSIS — Z8619 Personal history of other infectious and parasitic diseases: Secondary | ICD-10-CM | POA: Diagnosis not present

## 2019-11-05 ENCOUNTER — Ambulatory Visit: Payer: Medicare Other

## 2019-11-19 ENCOUNTER — Other Ambulatory Visit: Payer: Self-pay | Admitting: Obstetrics and Gynecology

## 2019-11-19 DIAGNOSIS — Z1231 Encounter for screening mammogram for malignant neoplasm of breast: Secondary | ICD-10-CM

## 2019-11-25 ENCOUNTER — Ambulatory Visit: Payer: Medicare Other

## 2019-11-25 ENCOUNTER — Ambulatory Visit: Payer: Medicare Other | Attending: Internal Medicine

## 2019-11-25 DIAGNOSIS — Z23 Encounter for immunization: Secondary | ICD-10-CM

## 2019-11-25 NOTE — Progress Notes (Signed)
   Covid-19 Vaccination Clinic  Name:  Dannie Ratterree    MRN: [id number] DOB: [birth year]  11/25/2019  Ms. Meir was observed post Covid-19 immunization for 15 minutes without incident. She was provided with Vaccine Information Sheet and instruction to access the V-Safe system.   Ms. Bergstrom was instructed to call 911 with any severe reactions post vaccine: Marland Kitchen Difficulty breathing  . Swelling of face and throat  . A fast heartbeat  . A bad rash all over body  . Dizziness and weakness   Immunizations Administered    Name Date Dose VIS Date Route   Pfizer COVID-19 Vaccine 11/25/2019  1:28 PM 0.3 mL 09/04/2019 Intramuscular   Manufacturer: [place]   Lot: [id number]   [place]: [id number]

## 2019-12-08 DIAGNOSIS — Z1159 Encounter for screening for other viral diseases: Secondary | ICD-10-CM | POA: Diagnosis not present

## 2019-12-11 DIAGNOSIS — D121 Benign neoplasm of appendix: Secondary | ICD-10-CM | POA: Diagnosis not present

## 2019-12-11 DIAGNOSIS — K648 Other hemorrhoids: Secondary | ICD-10-CM | POA: Diagnosis not present

## 2019-12-11 DIAGNOSIS — Z86010 Personal history of colonic polyps: Secondary | ICD-10-CM | POA: Diagnosis not present

## 2019-12-11 DIAGNOSIS — K5140 Inflammatory polyps of colon without complications: Secondary | ICD-10-CM | POA: Diagnosis not present

## 2019-12-11 DIAGNOSIS — K621 Rectal polyp: Secondary | ICD-10-CM | POA: Diagnosis not present

## 2019-12-11 DIAGNOSIS — K5730 Diverticulosis of large intestine without perforation or abscess without bleeding: Secondary | ICD-10-CM | POA: Diagnosis not present

## 2019-12-11 DIAGNOSIS — K602 Anal fissure, unspecified: Secondary | ICD-10-CM | POA: Diagnosis not present

## 2019-12-15 DIAGNOSIS — D121 Benign neoplasm of appendix: Secondary | ICD-10-CM | POA: Diagnosis not present

## 2019-12-15 DIAGNOSIS — K5140 Inflammatory polyps of colon without complications: Secondary | ICD-10-CM | POA: Diagnosis not present

## 2019-12-15 DIAGNOSIS — K621 Rectal polyp: Secondary | ICD-10-CM | POA: Diagnosis not present

## 2019-12-17 DIAGNOSIS — E7800 Pure hypercholesterolemia, unspecified: Secondary | ICD-10-CM | POA: Diagnosis not present

## 2019-12-17 DIAGNOSIS — I10 Essential (primary) hypertension: Secondary | ICD-10-CM | POA: Diagnosis not present

## 2019-12-17 DIAGNOSIS — E119 Type 2 diabetes mellitus without complications: Secondary | ICD-10-CM | POA: Diagnosis not present

## 2019-12-17 DIAGNOSIS — E1169 Type 2 diabetes mellitus with other specified complication: Secondary | ICD-10-CM | POA: Diagnosis not present

## 2019-12-30 ENCOUNTER — Ambulatory Visit
Admission: RE | Admit: 2019-12-30 | Discharge: 2019-12-30 | Disposition: A | Discharge status: Home / Self Care | Payer: Medicare Other | Source: Ambulatory Visit | Attending: Obstetrics and Gynecology | Admitting: Obstetrics and Gynecology

## 2019-12-30 ENCOUNTER — Other Ambulatory Visit: Payer: Self-pay

## 2019-12-30 DIAGNOSIS — Z1231 Encounter for screening mammogram for malignant neoplasm of breast: Secondary | ICD-10-CM

## 2020-01-27 DIAGNOSIS — H9191 Unspecified hearing loss, right ear: Secondary | ICD-10-CM | POA: Diagnosis not present

## 2020-01-27 DIAGNOSIS — H6123 Impacted cerumen, bilateral: Secondary | ICD-10-CM | POA: Diagnosis not present

## 2020-01-27 DIAGNOSIS — Z8669 Personal history of other diseases of the nervous system and sense organs: Secondary | ICD-10-CM | POA: Diagnosis not present

## 2020-02-03 DIAGNOSIS — L433 Subacute (active) lichen planus: Secondary | ICD-10-CM | POA: Diagnosis not present

## 2020-02-03 DIAGNOSIS — D2239 Melanocytic nevi of other parts of face: Secondary | ICD-10-CM | POA: Diagnosis not present

## 2020-02-03 DIAGNOSIS — D485 Neoplasm of uncertain behavior of skin: Secondary | ICD-10-CM | POA: Diagnosis not present

## 2020-02-03 DIAGNOSIS — D1801 Hemangioma of skin and subcutaneous tissue: Secondary | ICD-10-CM | POA: Diagnosis not present

## 2020-02-03 DIAGNOSIS — L570 Actinic keratosis: Secondary | ICD-10-CM | POA: Diagnosis not present

## 2020-02-03 DIAGNOSIS — L821 Other seborrheic keratosis: Secondary | ICD-10-CM | POA: Diagnosis not present

## 2020-02-19 DIAGNOSIS — H90A22 Sensorineural hearing loss, unilateral, left ear, with restricted hearing on the contralateral side: Secondary | ICD-10-CM | POA: Diagnosis not present

## 2020-02-19 DIAGNOSIS — H90A31 Mixed conductive and sensorineural hearing loss, unilateral, right ear with restricted hearing on the contralateral side: Secondary | ICD-10-CM | POA: Diagnosis not present

## 2020-02-24 DIAGNOSIS — E1169 Type 2 diabetes mellitus with other specified complication: Secondary | ICD-10-CM | POA: Diagnosis not present

## 2020-02-24 DIAGNOSIS — I10 Essential (primary) hypertension: Secondary | ICD-10-CM | POA: Diagnosis not present

## 2020-02-24 DIAGNOSIS — Z794 Long term (current) use of insulin: Secondary | ICD-10-CM | POA: Diagnosis not present

## 2020-03-23 DIAGNOSIS — I10 Essential (primary) hypertension: Secondary | ICD-10-CM | POA: Diagnosis not present

## 2020-03-23 DIAGNOSIS — E1169 Type 2 diabetes mellitus with other specified complication: Secondary | ICD-10-CM | POA: Diagnosis not present

## 2020-03-23 DIAGNOSIS — E7800 Pure hypercholesterolemia, unspecified: Secondary | ICD-10-CM | POA: Diagnosis not present

## 2020-03-23 DIAGNOSIS — E119 Type 2 diabetes mellitus without complications: Secondary | ICD-10-CM | POA: Diagnosis not present

## 2020-05-19 DIAGNOSIS — L821 Other seborrheic keratosis: Secondary | ICD-10-CM | POA: Diagnosis not present

## 2020-05-19 DIAGNOSIS — L720 Epidermal cyst: Secondary | ICD-10-CM | POA: Diagnosis not present

## 2020-05-19 DIAGNOSIS — L570 Actinic keratosis: Secondary | ICD-10-CM | POA: Diagnosis not present

## 2020-06-09 DIAGNOSIS — E1169 Type 2 diabetes mellitus with other specified complication: Secondary | ICD-10-CM | POA: Diagnosis not present

## 2020-06-09 DIAGNOSIS — K219 Gastro-esophageal reflux disease without esophagitis: Secondary | ICD-10-CM | POA: Diagnosis not present

## 2020-06-09 DIAGNOSIS — E119 Type 2 diabetes mellitus without complications: Secondary | ICD-10-CM | POA: Diagnosis not present

## 2020-06-09 DIAGNOSIS — I10 Essential (primary) hypertension: Secondary | ICD-10-CM | POA: Diagnosis not present

## 2020-06-09 DIAGNOSIS — E7800 Pure hypercholesterolemia, unspecified: Secondary | ICD-10-CM | POA: Diagnosis not present

## 2020-06-17 DIAGNOSIS — Z23 Encounter for immunization: Secondary | ICD-10-CM | POA: Diagnosis not present

## 2020-07-05 DIAGNOSIS — Z23 Encounter for immunization: Secondary | ICD-10-CM | POA: Diagnosis not present

## 2020-07-11 DIAGNOSIS — N3281 Overactive bladder: Secondary | ICD-10-CM | POA: Diagnosis not present

## 2020-07-11 DIAGNOSIS — Z794 Long term (current) use of insulin: Secondary | ICD-10-CM | POA: Diagnosis not present

## 2020-07-11 DIAGNOSIS — I10 Essential (primary) hypertension: Secondary | ICD-10-CM | POA: Diagnosis not present

## 2020-07-11 DIAGNOSIS — K219 Gastro-esophageal reflux disease without esophagitis: Secondary | ICD-10-CM | POA: Diagnosis not present

## 2020-07-11 DIAGNOSIS — E1169 Type 2 diabetes mellitus with other specified complication: Secondary | ICD-10-CM | POA: Diagnosis not present

## 2020-08-12 DIAGNOSIS — K219 Gastro-esophageal reflux disease without esophagitis: Secondary | ICD-10-CM | POA: Diagnosis not present

## 2020-08-12 DIAGNOSIS — E7800 Pure hypercholesterolemia, unspecified: Secondary | ICD-10-CM | POA: Diagnosis not present

## 2020-08-12 DIAGNOSIS — E1169 Type 2 diabetes mellitus with other specified complication: Secondary | ICD-10-CM | POA: Diagnosis not present

## 2020-08-12 DIAGNOSIS — E119 Type 2 diabetes mellitus without complications: Secondary | ICD-10-CM | POA: Diagnosis not present

## 2020-08-12 DIAGNOSIS — I10 Essential (primary) hypertension: Secondary | ICD-10-CM | POA: Diagnosis not present

## 2020-08-25 DIAGNOSIS — R1900 Intra-abdominal and pelvic swelling, mass and lump, unspecified site: Secondary | ICD-10-CM | POA: Diagnosis not present

## 2020-08-29 ENCOUNTER — Other Ambulatory Visit: Payer: Self-pay | Admitting: Surgery

## 2020-08-29 DIAGNOSIS — R1900 Intra-abdominal and pelvic swelling, mass and lump, unspecified site: Secondary | ICD-10-CM

## 2020-09-01 ENCOUNTER — Other Ambulatory Visit: Payer: Self-pay

## 2020-09-01 ENCOUNTER — Ambulatory Visit
Admission: RE | Admit: 2020-09-01 | Discharge: 2020-09-01 | Disposition: A | Discharge status: Home / Self Care | Payer: Medicare Other | Source: Ambulatory Visit | Attending: Surgery | Admitting: Surgery

## 2020-09-01 DIAGNOSIS — R222 Localized swelling, mass and lump, trunk: Secondary | ICD-10-CM | POA: Diagnosis not present

## 2020-09-01 DIAGNOSIS — K429 Umbilical hernia without obstruction or gangrene: Secondary | ICD-10-CM | POA: Diagnosis not present

## 2020-09-01 DIAGNOSIS — K5730 Diverticulosis of large intestine without perforation or abscess without bleeding: Secondary | ICD-10-CM | POA: Diagnosis not present

## 2020-09-01 DIAGNOSIS — R1900 Intra-abdominal and pelvic swelling, mass and lump, unspecified site: Secondary | ICD-10-CM

## 2020-09-01 DIAGNOSIS — Z853 Personal history of malignant neoplasm of breast: Secondary | ICD-10-CM | POA: Diagnosis not present

## 2020-09-01 MED ORDER — IOPAMIDOL (ISOVUE-300) INJECTION 61%
100.0000 mL | Freq: Once | INTRAVENOUS | Status: AC | PRN
  Administered 2020-09-01: 100 mL via INTRAVENOUS

## 2020-10-06 DIAGNOSIS — I10 Essential (primary) hypertension: Secondary | ICD-10-CM | POA: Diagnosis not present

## 2020-10-06 DIAGNOSIS — Z1389 Encounter for screening for other disorder: Secondary | ICD-10-CM | POA: Diagnosis not present

## 2020-10-06 DIAGNOSIS — M549 Dorsalgia, unspecified: Secondary | ICD-10-CM | POA: Diagnosis not present

## 2020-10-06 DIAGNOSIS — Z794 Long term (current) use of insulin: Secondary | ICD-10-CM | POA: Diagnosis not present

## 2020-10-06 DIAGNOSIS — Z0000 Encounter for general adult medical examination without abnormal findings: Secondary | ICD-10-CM | POA: Diagnosis not present

## 2020-10-06 DIAGNOSIS — E7800 Pure hypercholesterolemia, unspecified: Secondary | ICD-10-CM | POA: Diagnosis not present

## 2020-10-06 DIAGNOSIS — E538 Deficiency of other specified B group vitamins: Secondary | ICD-10-CM | POA: Diagnosis not present

## 2020-10-06 DIAGNOSIS — E1169 Type 2 diabetes mellitus with other specified complication: Secondary | ICD-10-CM | POA: Diagnosis not present

## 2020-10-06 DIAGNOSIS — N3281 Overactive bladder: Secondary | ICD-10-CM | POA: Diagnosis not present

## 2020-10-06 DIAGNOSIS — K219 Gastro-esophageal reflux disease without esophagitis: Secondary | ICD-10-CM | POA: Diagnosis not present

## 2020-10-06 DIAGNOSIS — Z8639 Personal history of other endocrine, nutritional and metabolic disease: Secondary | ICD-10-CM | POA: Diagnosis not present

## 2020-10-07 DIAGNOSIS — E119 Type 2 diabetes mellitus without complications: Secondary | ICD-10-CM | POA: Diagnosis not present

## 2020-10-07 DIAGNOSIS — H2513 Age-related nuclear cataract, bilateral: Secondary | ICD-10-CM | POA: Diagnosis not present

## 2020-10-07 DIAGNOSIS — H5203 Hypermetropia, bilateral: Secondary | ICD-10-CM | POA: Diagnosis not present

## 2020-10-07 DIAGNOSIS — H349 Unspecified retinal vascular occlusion: Secondary | ICD-10-CM | POA: Diagnosis not present

## 2020-10-12 ENCOUNTER — Other Ambulatory Visit: Payer: Self-pay | Admitting: Internal Medicine

## 2020-10-12 DIAGNOSIS — H34231 Retinal artery branch occlusion, right eye: Secondary | ICD-10-CM

## 2020-10-13 ENCOUNTER — Other Ambulatory Visit (HOSPITAL_COMMUNITY): Payer: Self-pay | Admitting: Internal Medicine

## 2020-10-13 ENCOUNTER — Other Ambulatory Visit: Payer: Self-pay

## 2020-10-13 ENCOUNTER — Ambulatory Visit (HOSPITAL_COMMUNITY)
Admission: RE | Admit: 2020-10-13 | Discharge: 2020-10-13 | Disposition: A | Discharge status: Home / Self Care | Payer: Medicare Other | Source: Ambulatory Visit | Attending: Cardiology | Admitting: Cardiology

## 2020-10-13 DIAGNOSIS — H34231 Retinal artery branch occlusion, right eye: Secondary | ICD-10-CM

## 2020-10-24 DIAGNOSIS — L84 Corns and callosities: Secondary | ICD-10-CM | POA: Diagnosis not present

## 2020-10-24 DIAGNOSIS — L570 Actinic keratosis: Secondary | ICD-10-CM | POA: Diagnosis not present

## 2020-10-24 DIAGNOSIS — L821 Other seborrheic keratosis: Secondary | ICD-10-CM | POA: Diagnosis not present

## 2020-10-24 DIAGNOSIS — L820 Inflamed seborrheic keratosis: Secondary | ICD-10-CM | POA: Diagnosis not present

## 2020-10-24 DIAGNOSIS — L720 Epidermal cyst: Secondary | ICD-10-CM | POA: Diagnosis not present

## 2020-11-03 ENCOUNTER — Other Ambulatory Visit: Payer: Self-pay | Admitting: Internal Medicine

## 2020-11-03 DIAGNOSIS — Z1231 Encounter for screening mammogram for malignant neoplasm of breast: Secondary | ICD-10-CM

## 2020-11-03 DIAGNOSIS — Z8619 Personal history of other infectious and parasitic diseases: Secondary | ICD-10-CM | POA: Diagnosis not present

## 2020-11-03 DIAGNOSIS — Z9189 Other specified personal risk factors, not elsewhere classified: Secondary | ICD-10-CM | POA: Diagnosis not present

## 2020-11-04 ENCOUNTER — Ambulatory Visit (HOSPITAL_COMMUNITY): Payer: Medicare Other | Attending: Cardiology

## 2020-11-04 ENCOUNTER — Other Ambulatory Visit: Payer: Self-pay

## 2020-11-04 DIAGNOSIS — H34231 Retinal artery branch occlusion, right eye: Secondary | ICD-10-CM | POA: Diagnosis not present

## 2020-11-04 LAB — ECHOCARDIOGRAM COMPLETE
Area-P 1/2: 3.08 cm2
S' Lateral: 1.9 cm

## 2020-12-05 DIAGNOSIS — H349 Unspecified retinal vascular occlusion: Secondary | ICD-10-CM | POA: Diagnosis not present

## 2020-12-07 DIAGNOSIS — E7800 Pure hypercholesterolemia, unspecified: Secondary | ICD-10-CM | POA: Diagnosis not present

## 2020-12-07 DIAGNOSIS — I10 Essential (primary) hypertension: Secondary | ICD-10-CM | POA: Diagnosis not present

## 2020-12-07 DIAGNOSIS — E119 Type 2 diabetes mellitus without complications: Secondary | ICD-10-CM | POA: Diagnosis not present

## 2020-12-07 DIAGNOSIS — K219 Gastro-esophageal reflux disease without esophagitis: Secondary | ICD-10-CM | POA: Diagnosis not present

## 2020-12-07 DIAGNOSIS — E1169 Type 2 diabetes mellitus with other specified complication: Secondary | ICD-10-CM | POA: Diagnosis not present

## 2020-12-30 ENCOUNTER — Inpatient Hospital Stay: Admission: RE | Admit: 2020-12-30 | Payer: Medicare Other | Source: Ambulatory Visit

## 2021-01-30 DIAGNOSIS — K219 Gastro-esophageal reflux disease without esophagitis: Secondary | ICD-10-CM | POA: Diagnosis not present

## 2021-01-30 DIAGNOSIS — D2239 Melanocytic nevi of other parts of face: Secondary | ICD-10-CM | POA: Diagnosis not present

## 2021-01-30 DIAGNOSIS — E1169 Type 2 diabetes mellitus with other specified complication: Secondary | ICD-10-CM | POA: Diagnosis not present

## 2021-01-30 DIAGNOSIS — L433 Subacute (active) lichen planus: Secondary | ICD-10-CM | POA: Diagnosis not present

## 2021-01-30 DIAGNOSIS — Z794 Long term (current) use of insulin: Secondary | ICD-10-CM | POA: Diagnosis not present

## 2021-01-30 DIAGNOSIS — L7211 Pilar cyst: Secondary | ICD-10-CM | POA: Diagnosis not present

## 2021-01-30 DIAGNOSIS — L821 Other seborrheic keratosis: Secondary | ICD-10-CM | POA: Diagnosis not present

## 2021-01-30 DIAGNOSIS — D1801 Hemangioma of skin and subcutaneous tissue: Secondary | ICD-10-CM | POA: Diagnosis not present

## 2021-01-30 DIAGNOSIS — I10 Essential (primary) hypertension: Secondary | ICD-10-CM | POA: Diagnosis not present

## 2021-02-16 ENCOUNTER — Other Ambulatory Visit: Payer: Self-pay

## 2021-02-16 ENCOUNTER — Ambulatory Visit
Admission: RE | Admit: 2021-02-16 | Discharge: 2021-02-16 | Disposition: A | Discharge status: Home / Self Care | Payer: Medicare Other | Source: Ambulatory Visit | Attending: Internal Medicine | Admitting: Internal Medicine

## 2021-02-16 DIAGNOSIS — Z1231 Encounter for screening mammogram for malignant neoplasm of breast: Secondary | ICD-10-CM

## 2021-02-16 HISTORY — DX: Malignant neoplasm of unspecified site of unspecified female breast: C50.919

## 2021-02-22 ENCOUNTER — Other Ambulatory Visit: Payer: Self-pay | Admitting: Internal Medicine

## 2021-02-22 DIAGNOSIS — Z1231 Encounter for screening mammogram for malignant neoplasm of breast: Secondary | ICD-10-CM

## 2021-03-06 DIAGNOSIS — E7800 Pure hypercholesterolemia, unspecified: Secondary | ICD-10-CM | POA: Diagnosis not present

## 2021-03-06 DIAGNOSIS — E1169 Type 2 diabetes mellitus with other specified complication: Secondary | ICD-10-CM | POA: Diagnosis not present

## 2021-03-06 DIAGNOSIS — E119 Type 2 diabetes mellitus without complications: Secondary | ICD-10-CM | POA: Diagnosis not present

## 2021-03-06 DIAGNOSIS — K219 Gastro-esophageal reflux disease without esophagitis: Secondary | ICD-10-CM | POA: Diagnosis not present

## 2021-03-06 DIAGNOSIS — I10 Essential (primary) hypertension: Secondary | ICD-10-CM | POA: Diagnosis not present

## 2021-03-13 DIAGNOSIS — Z23 Encounter for immunization: Secondary | ICD-10-CM | POA: Diagnosis not present

## 2021-03-14 DIAGNOSIS — S60450A Superficial foreign body of right index finger, initial encounter: Secondary | ICD-10-CM | POA: Diagnosis not present

## 2021-03-22 DIAGNOSIS — L720 Epidermal cyst: Secondary | ICD-10-CM | POA: Diagnosis not present

## 2021-03-22 DIAGNOSIS — L821 Other seborrheic keratosis: Secondary | ICD-10-CM | POA: Diagnosis not present

## 2021-03-22 DIAGNOSIS — D2239 Melanocytic nevi of other parts of face: Secondary | ICD-10-CM | POA: Diagnosis not present

## 2021-03-22 DIAGNOSIS — Z419 Encounter for procedure for purposes other than remedying health state, unspecified: Secondary | ICD-10-CM | POA: Diagnosis not present

## 2021-06-05 DIAGNOSIS — Z794 Long term (current) use of insulin: Secondary | ICD-10-CM | POA: Diagnosis not present

## 2021-06-05 DIAGNOSIS — Z23 Encounter for immunization: Secondary | ICD-10-CM | POA: Diagnosis not present

## 2021-06-05 DIAGNOSIS — R197 Diarrhea, unspecified: Secondary | ICD-10-CM | POA: Diagnosis not present

## 2021-06-05 DIAGNOSIS — I10 Essential (primary) hypertension: Secondary | ICD-10-CM | POA: Diagnosis not present

## 2021-06-05 DIAGNOSIS — E1169 Type 2 diabetes mellitus with other specified complication: Secondary | ICD-10-CM | POA: Diagnosis not present

## 2021-06-14 DIAGNOSIS — E1169 Type 2 diabetes mellitus with other specified complication: Secondary | ICD-10-CM | POA: Diagnosis not present

## 2021-06-14 DIAGNOSIS — E7800 Pure hypercholesterolemia, unspecified: Secondary | ICD-10-CM | POA: Diagnosis not present

## 2021-06-14 DIAGNOSIS — E119 Type 2 diabetes mellitus without complications: Secondary | ICD-10-CM | POA: Diagnosis not present

## 2021-06-14 DIAGNOSIS — K219 Gastro-esophageal reflux disease without esophagitis: Secondary | ICD-10-CM | POA: Diagnosis not present

## 2021-06-14 DIAGNOSIS — I10 Essential (primary) hypertension: Secondary | ICD-10-CM | POA: Diagnosis not present

## 2021-06-30 DIAGNOSIS — Z23 Encounter for immunization: Secondary | ICD-10-CM | POA: Diagnosis not present

## 2021-07-17 DIAGNOSIS — Z23 Encounter for immunization: Secondary | ICD-10-CM | POA: Diagnosis not present

## 2021-07-18 DIAGNOSIS — B351 Tinea unguium: Secondary | ICD-10-CM | POA: Diagnosis not present

## 2021-09-06 DIAGNOSIS — E1169 Type 2 diabetes mellitus with other specified complication: Secondary | ICD-10-CM | POA: Diagnosis not present

## 2021-09-06 DIAGNOSIS — I10 Essential (primary) hypertension: Secondary | ICD-10-CM | POA: Diagnosis not present

## 2021-09-06 DIAGNOSIS — E119 Type 2 diabetes mellitus without complications: Secondary | ICD-10-CM | POA: Diagnosis not present

## 2021-09-06 DIAGNOSIS — K219 Gastro-esophageal reflux disease without esophagitis: Secondary | ICD-10-CM | POA: Diagnosis not present

## 2021-09-06 DIAGNOSIS — E7800 Pure hypercholesterolemia, unspecified: Secondary | ICD-10-CM | POA: Diagnosis not present

## 2021-10-12 DIAGNOSIS — E7800 Pure hypercholesterolemia, unspecified: Secondary | ICD-10-CM | POA: Diagnosis not present

## 2021-10-12 DIAGNOSIS — I10 Essential (primary) hypertension: Secondary | ICD-10-CM | POA: Diagnosis not present

## 2021-10-12 DIAGNOSIS — E1169 Type 2 diabetes mellitus with other specified complication: Secondary | ICD-10-CM | POA: Diagnosis not present

## 2021-10-12 DIAGNOSIS — Z0000 Encounter for general adult medical examination without abnormal findings: Secondary | ICD-10-CM | POA: Diagnosis not present

## 2021-10-12 DIAGNOSIS — K219 Gastro-esophageal reflux disease without esophagitis: Secondary | ICD-10-CM | POA: Diagnosis not present

## 2021-10-12 DIAGNOSIS — L821 Other seborrheic keratosis: Secondary | ICD-10-CM | POA: Diagnosis not present

## 2021-10-12 DIAGNOSIS — Z1389 Encounter for screening for other disorder: Secondary | ICD-10-CM | POA: Diagnosis not present

## 2021-10-12 DIAGNOSIS — L858 Other specified epidermal thickening: Secondary | ICD-10-CM | POA: Diagnosis not present

## 2021-10-12 DIAGNOSIS — Z23 Encounter for immunization: Secondary | ICD-10-CM | POA: Diagnosis not present

## 2021-10-12 DIAGNOSIS — L820 Inflamed seborrheic keratosis: Secondary | ICD-10-CM | POA: Diagnosis not present

## 2021-10-12 DIAGNOSIS — N3281 Overactive bladder: Secondary | ICD-10-CM | POA: Diagnosis not present

## 2021-10-12 DIAGNOSIS — L570 Actinic keratosis: Secondary | ICD-10-CM | POA: Diagnosis not present

## 2021-10-12 DIAGNOSIS — B351 Tinea unguium: Secondary | ICD-10-CM | POA: Diagnosis not present

## 2021-10-25 DIAGNOSIS — H15102 Unspecified episcleritis, left eye: Secondary | ICD-10-CM | POA: Diagnosis not present

## 2021-11-06 DIAGNOSIS — Z8619 Personal history of other infectious and parasitic diseases: Secondary | ICD-10-CM | POA: Diagnosis not present

## 2021-11-06 DIAGNOSIS — Z9189 Other specified personal risk factors, not elsewhere classified: Secondary | ICD-10-CM | POA: Diagnosis not present

## 2021-11-07 DIAGNOSIS — K602 Anal fissure, unspecified: Secondary | ICD-10-CM | POA: Diagnosis not present

## 2021-11-07 DIAGNOSIS — L309 Dermatitis, unspecified: Secondary | ICD-10-CM | POA: Diagnosis not present

## 2021-11-07 DIAGNOSIS — K5900 Constipation, unspecified: Secondary | ICD-10-CM | POA: Diagnosis not present

## 2021-11-07 DIAGNOSIS — Z86010 Personal history of colonic polyps: Secondary | ICD-10-CM | POA: Diagnosis not present

## 2021-11-14 DIAGNOSIS — E119 Type 2 diabetes mellitus without complications: Secondary | ICD-10-CM | POA: Diagnosis not present

## 2021-11-14 DIAGNOSIS — H5203 Hypermetropia, bilateral: Secondary | ICD-10-CM | POA: Diagnosis not present

## 2021-11-14 DIAGNOSIS — H2513 Age-related nuclear cataract, bilateral: Secondary | ICD-10-CM | POA: Diagnosis not present

## 2022-01-31 DIAGNOSIS — L814 Other melanin hyperpigmentation: Secondary | ICD-10-CM | POA: Diagnosis not present

## 2022-01-31 DIAGNOSIS — D1801 Hemangioma of skin and subcutaneous tissue: Secondary | ICD-10-CM | POA: Diagnosis not present

## 2022-01-31 DIAGNOSIS — L84 Corns and callosities: Secondary | ICD-10-CM | POA: Diagnosis not present

## 2022-01-31 DIAGNOSIS — L308 Other specified dermatitis: Secondary | ICD-10-CM | POA: Diagnosis not present

## 2022-01-31 DIAGNOSIS — L821 Other seborrheic keratosis: Secondary | ICD-10-CM | POA: Diagnosis not present

## 2022-01-31 DIAGNOSIS — D2239 Melanocytic nevi of other parts of face: Secondary | ICD-10-CM | POA: Diagnosis not present

## 2022-02-09 DIAGNOSIS — I10 Essential (primary) hypertension: Secondary | ICD-10-CM | POA: Diagnosis not present

## 2022-02-09 DIAGNOSIS — K219 Gastro-esophageal reflux disease without esophagitis: Secondary | ICD-10-CM | POA: Diagnosis not present

## 2022-02-09 DIAGNOSIS — Z794 Long term (current) use of insulin: Secondary | ICD-10-CM | POA: Diagnosis not present

## 2022-02-09 DIAGNOSIS — E1169 Type 2 diabetes mellitus with other specified complication: Secondary | ICD-10-CM | POA: Diagnosis not present

## 2022-02-20 ENCOUNTER — Ambulatory Visit
Admission: RE | Admit: 2022-02-20 | Discharge: 2022-02-20 | Disposition: A | Discharge status: Home / Self Care | Payer: Medicare Other | Source: Ambulatory Visit | Attending: Internal Medicine | Admitting: Internal Medicine

## 2022-02-20 DIAGNOSIS — Z1231 Encounter for screening mammogram for malignant neoplasm of breast: Secondary | ICD-10-CM | POA: Diagnosis not present

## 2022-02-26 DIAGNOSIS — E7800 Pure hypercholesterolemia, unspecified: Secondary | ICD-10-CM | POA: Diagnosis not present

## 2022-02-26 DIAGNOSIS — K219 Gastro-esophageal reflux disease without esophagitis: Secondary | ICD-10-CM | POA: Diagnosis not present

## 2022-02-26 DIAGNOSIS — E1169 Type 2 diabetes mellitus with other specified complication: Secondary | ICD-10-CM | POA: Diagnosis not present

## 2022-02-26 DIAGNOSIS — I10 Essential (primary) hypertension: Secondary | ICD-10-CM | POA: Diagnosis not present

## 2022-04-02 DIAGNOSIS — Z23 Encounter for immunization: Secondary | ICD-10-CM | POA: Diagnosis not present

## 2022-05-31 ENCOUNTER — Ambulatory Visit (HOSPITAL_COMMUNITY)
Admission: EM | Admit: 2022-05-31 | Discharge: 2022-05-31 | Disposition: A | Discharge status: Home / Self Care | Payer: Medicare Other | Attending: Sports Medicine | Admitting: Sports Medicine

## 2022-05-31 ENCOUNTER — Ambulatory Visit (INDEPENDENT_AMBULATORY_CARE_PROVIDER_SITE_OTHER): Payer: Medicare Other

## 2022-05-31 ENCOUNTER — Encounter (HOSPITAL_COMMUNITY): Payer: Self-pay | Admitting: *Deleted

## 2022-05-31 DIAGNOSIS — M25521 Pain in right elbow: Secondary | ICD-10-CM

## 2022-05-31 DIAGNOSIS — W19XXXA Unspecified fall, initial encounter: Secondary | ICD-10-CM

## 2022-05-31 DIAGNOSIS — M25421 Effusion, right elbow: Secondary | ICD-10-CM | POA: Diagnosis not present

## 2022-05-31 MED ORDER — IBUPROFEN 800 MG PO TABS: 800.0000 mg | ORAL_TABLET | Freq: Three times a day (TID) | ORAL | 0 refills | Status: AC

## 2022-05-31 NOTE — ED Triage Notes (Signed)
Pt went out to take dog out last night and she missed a step and fell. She hit the patio which is pebbles, she hit the hit side of her head, right arm, elbow and wrist. She didn't have any loss of conscious, no double vision or vomiting. She does have a cut on the right side of her face where her glasses broke and cut her face. Her son did come and check on her last night. She took some tylenol last night nothing today.

## 2022-05-31 NOTE — Discharge Instructions (Addendum)
Your x-ray was suspicious for a possible fracture at the elbow.  I recommend staying immobilized in the sling until you are able to see orthopedic doctor.  I have provided to locations for you to call and follow-up with whomever you choose.  You may use ibuprofen 800 mg 2-3 times a day for pain with food.  If you have breakthrough pain you may incorporate Tylenol.  Continue with icing 2-3 times a day 15 minutes on 15 minutes off.

## 2022-05-31 NOTE — ED Provider Notes (Signed)
[facility]    CSN: [id number] Arrival date & time: 05/31/22  0849    History   Chief Complaint Chief Complaint  Patient presents with   Fall    HPI Shelby Morrow is a 76 y.o. female.   She presents today with chief complaint of a fall overnight where she bumped the side of her head right arm elbow and wrist.  She denied any loss of consciousness after the event.  She was able to get home.  Her son helped her clean up the small abrasions to the side of her face likely from her glasses breaking.  She initially had elbow and forearm pain which has improved this morning after 1 dose of Tylenol.  She denies any headaches, blurry vision, loss of consciousness.  Past Medical History:  Diagnosis Date   Acid reflux    Anemia    Anxiety    Breast cancer ([place])    Cancer ([place]) 2001   Breast DCIS   Deafness in right ear 08/26/2013   Depression    hx of .   Diabetes mellitus    Goiter    H/O hiatal hernia    Hyperlipidemia    Hypertension    Overactive bladder    Positive TB test     Patient Active Problem List   Diagnosis Date Noted   Multinodular goiter (nontoxic) 05/18/2013   Thyroglossal duct cyst 05/18/2013    Past Surgical History:  Procedure Laterality Date   ABDOMINAL HYSTERECTOMY  2006   ADRENALECTOMY Right 1977   birth mark Right    ear, 5 surgeries   BREAST LUMPECTOMY Right 2001   BREAST SURGERY Right 2005,2001   CARDIAC CATHETERIZATION  2004   DILATION AND CURETTAGE OF UTERUS  1980s   GANGLION CYST EXCISION Right 1950s   HEMANGIOMA EXCISION  2007   left nare   MASTECTOMY PARTIAL / LUMPECTOMY Right 2001   REDUCTION MAMMAPLASTY Left    THYROGLOSSAL DUCT CYST N/A 08/26/2013   Procedure: EXCISIONAL THYROGLOSSAL DUCT CYST;  Surgeon: Jerrell Belfast, MD;  Location: [facility] OR;  Service: ENT;  Laterality: N/A;   THYROID CYST EXCISION  08/26/2013   tummy tuck  2004   TYMPANOPLASTY  1965    OB History   No obstetric history on file.      Home  Medications    Prior to Admission medications   Medication Sig Start Date End Date Taking? Authorizing Provider  amLODipine (NORVASC) 5 MG tablet Take 5 mg by mouth daily.   Yes [provider]  atenolol (TENORMIN) 25 MG tablet Take 25 mg by mouth 2 (two) times daily.    Yes [provider]  Cholecalciferol (VITAMIN D) 2000 UNITS CAPS Take 2,000 Units by mouth daily.   Yes [provider]  ibuprofen (ADVIL) 800 MG tablet Take 1 tablet (800 mg total) by mouth 3 (three) times daily. 05/31/22  Yes Rodena Goldmann A, DO  insulin glargine (LANTUS) 100 UNIT/ML injection Inject 44 Units into the skin at bedtime. *per blood sugars 130=30 units, above 130=36 units   Yes [provider]  metFORMIN (GLUCOPHAGE-XR) 500 MG 24 hr tablet Take 1,000 mg by mouth 2 (two) times daily.   Yes [provider]  oxybutynin (DITROPAN XL) 15 MG 24 hr tablet Take 15 mg by mouth daily.     Yes [provider]  pantoprazole (PROTONIX) 40 MG tablet Take 40 mg by mouth daily.  05/08/13  Yes [provider]  rosuvastatin (CRESTOR)  10 MG tablet Take 10 mg by mouth daily.     Yes [provider]  VICTOZA 18 MG/3ML SOPN Inject 1.2 mg into the skin daily.  05/08/13  Yes [provider]  vitamin B-12 (CYANOCOBALAMIN) 1000 MCG tablet Take 1,000 mcg by mouth daily.   Yes [provider]  ferrous sulfate 325 (65 FE) MG tablet Take 325 mg by mouth daily with breakfast.    [provider]  potassium chloride (MICRO-K) 10 MEQ CR capsule Take 10 mEq by mouth daily.  05/08/13   [provider]    Family History Family History  Problem Relation Age of Onset   Cancer Maternal Grandmother        breast   Breast cancer Maternal Grandmother     Social History Social History   Tobacco Use   Smoking status: Never   Smokeless tobacco: Never  Substance Use Topics   Alcohol use: Yes    Comment: occ   Drug use: No     Allergies    Morphine and related, Adhesive [tape], Amoxapine and related, Demerol [meperidine], and Sulfa antibiotics   Review of Systems Review of Systems as listed above in HPI   Physical Exam Triage Vital Signs ED Triage Vitals  Enc Vitals Group     BP 05/31/22 0907 126/82     Pulse Rate 05/31/22 0907 69     Resp 05/31/22 0907 18     Temp 05/31/22 0907 98.2 F (36.8 C)     Temp Source 05/31/22 0907 Oral     SpO2 05/31/22 0907 95 %     Weight --      Height --      Head Circumference --      Peak Flow --      Pain Score 05/31/22 0905 5     Pain Loc --      Pain Edu? --      Excl. in [place]? --    No data found.  Updated Vital Signs BP 126/82 (BP Location: Left Arm)   Pulse 69   Temp 98.2 F (36.8 C) (Oral)   Resp 18   SpO2 95%   Physical Exam Vitals reviewed.  Constitutional:      Appearance: Normal appearance.  HENT:     Head: Normocephalic.     Comments: 2 small abrasions on the lateral face at the line of the eyebrow Cardiovascular:     Rate and Rhythm: Normal rate.  Pulmonary:     Effort: Pulmonary effort is normal.  Neurological:     Mental Status: She is alert.   Right elbow: No obvious deformity or asymmetry.  Ecchymosis on the lateral posterior side of the elbow.  Tenderness to palpation over the lateral posterior elbow slight discomfort to palpation on the lateral antecubital fossa.  No tenderness to palpation of the olecranon.  Patient has slightly decreased range of motion from about 5 degrees to 115 degrees.  Sensation intact to light touch.  Distal radial pulse 2+   UC Treatments / Results  Labs (all labs ordered are listed, but only abnormal results are displayed) Labs Reviewed - No data to display  EKG   Radiology DG Elbow Complete Right  Result Date: 05/31/2022 CLINICAL DATA:  Elbow pain after fall last night. EXAM: RIGHT ELBOW - COMPLETE 3+ VIEW COMPARISON:  None Available. FINDINGS: Small elbow joint effusion. Cortical irregularity of the coronoid  process, best appreciated on the AP and oblique views. No elbow joint dislocation.  Soft tissues are unremarkable. IMPRESSION: Cortical irregularity of the coronoid process may represent a minimally displaced fracture. Recommend further evaluation with CT of the elbow. Small elbow joint effusion. Electronically Signed   By: Ileana Roup M.D.   On: 05/31/2022 09:43    Procedures Procedures (including critical care time)  Medications Ordered in UC Medications - No data to display  Initial Impression / Assessment and Plan / UC Course  I have reviewed the triage vital signs and the nursing notes.  Pertinent labs & imaging results that were available during my care of the patient were reviewed by me and considered in my medical decision making (see chart for details).     Patient underwent right elbow x-ray 3 view which was suspicious for possible nondisplaced fracture of the coronoid process.  Patient's pain was improved today however she did have some tenderness about the lateral aspect of her elbow.  She was placed in an immobilizing sling.  Recommend follow-up with orthopedics over the next 7 days.  Remain in immobilizer until evaluated by orthopedics.  Patient was given prescription for 800 mg ibuprofen that she may take 2-3 times a day as needed and recommended icing for the next couple days 15 to 20 minutes on 15 to 20 minutes off.  She verbalized understanding.  If patient is unable to be evaluated by orthopedics in the next 7 days recommend follow-up at urgent care. Final Clinical Impressions(s) / UC Diagnoses   Final diagnoses:  Fall, initial encounter  Right elbow pain     Discharge Instructions      Your x-ray was suspicious for a possible fracture at the elbow.  I recommend staying immobilized in the sling until you are able to see orthopedic doctor.  I have provided to locations for you to call and follow-up with whomever you choose.  You may use ibuprofen 800 mg 2-3 times a day  for pain with food.  If you have breakthrough pain you may incorporate Tylenol.  Continue with icing 2-3 times a day 15 minutes on 15 minutes off.    ED Prescriptions     Medication Sig Dispense Auth. Provider   ibuprofen (ADVIL) 800 MG tablet Take 1 tablet (800 mg total) by mouth 3 (three) times daily. 21 tablet Rodena Goldmann A, DO      PDMP not reviewed this encounter.   Rodena Goldmann A, DO 05/31/22 1047

## 2022-06-06 DIAGNOSIS — S5001XA Contusion of right elbow, initial encounter: Secondary | ICD-10-CM | POA: Diagnosis not present

## 2022-06-08 DIAGNOSIS — R2689 Other abnormalities of gait and mobility: Secondary | ICD-10-CM | POA: Diagnosis not present

## 2022-06-08 DIAGNOSIS — M6281 Muscle weakness (generalized): Secondary | ICD-10-CM | POA: Diagnosis not present

## 2022-06-11 DIAGNOSIS — R2689 Other abnormalities of gait and mobility: Secondary | ICD-10-CM | POA: Diagnosis not present

## 2022-06-11 DIAGNOSIS — M6281 Muscle weakness (generalized): Secondary | ICD-10-CM | POA: Diagnosis not present

## 2022-06-13 DIAGNOSIS — M6281 Muscle weakness (generalized): Secondary | ICD-10-CM | POA: Diagnosis not present

## 2022-06-13 DIAGNOSIS — R2689 Other abnormalities of gait and mobility: Secondary | ICD-10-CM | POA: Diagnosis not present

## 2022-06-15 DIAGNOSIS — E1169 Type 2 diabetes mellitus with other specified complication: Secondary | ICD-10-CM | POA: Diagnosis not present

## 2022-06-15 DIAGNOSIS — Z23 Encounter for immunization: Secondary | ICD-10-CM | POA: Diagnosis not present

## 2022-06-15 DIAGNOSIS — K219 Gastro-esophageal reflux disease without esophagitis: Secondary | ICD-10-CM | POA: Diagnosis not present

## 2022-06-15 DIAGNOSIS — Z7984 Long term (current) use of oral hypoglycemic drugs: Secondary | ICD-10-CM | POA: Diagnosis not present

## 2022-06-15 DIAGNOSIS — Z794 Long term (current) use of insulin: Secondary | ICD-10-CM | POA: Diagnosis not present

## 2022-06-15 DIAGNOSIS — I10 Essential (primary) hypertension: Secondary | ICD-10-CM | POA: Diagnosis not present

## 2022-06-15 DIAGNOSIS — E7800 Pure hypercholesterolemia, unspecified: Secondary | ICD-10-CM | POA: Diagnosis not present

## 2022-06-18 DIAGNOSIS — R2689 Other abnormalities of gait and mobility: Secondary | ICD-10-CM | POA: Diagnosis not present

## 2022-06-18 DIAGNOSIS — M6281 Muscle weakness (generalized): Secondary | ICD-10-CM | POA: Diagnosis not present

## 2022-06-20 DIAGNOSIS — M6281 Muscle weakness (generalized): Secondary | ICD-10-CM | POA: Diagnosis not present

## 2022-06-20 DIAGNOSIS — R2689 Other abnormalities of gait and mobility: Secondary | ICD-10-CM | POA: Diagnosis not present

## 2022-06-22 DIAGNOSIS — M25521 Pain in right elbow: Secondary | ICD-10-CM | POA: Diagnosis not present

## 2022-06-22 DIAGNOSIS — M67911 Unspecified disorder of synovium and tendon, right shoulder: Secondary | ICD-10-CM | POA: Diagnosis not present

## 2022-06-25 DIAGNOSIS — R2689 Other abnormalities of gait and mobility: Secondary | ICD-10-CM | POA: Diagnosis not present

## 2022-06-25 DIAGNOSIS — M6281 Muscle weakness (generalized): Secondary | ICD-10-CM | POA: Diagnosis not present

## 2022-06-27 DIAGNOSIS — M6281 Muscle weakness (generalized): Secondary | ICD-10-CM | POA: Diagnosis not present

## 2022-06-27 DIAGNOSIS — R2689 Other abnormalities of gait and mobility: Secondary | ICD-10-CM | POA: Diagnosis not present

## 2022-07-02 DIAGNOSIS — M6281 Muscle weakness (generalized): Secondary | ICD-10-CM | POA: Diagnosis not present

## 2022-07-02 DIAGNOSIS — R2689 Other abnormalities of gait and mobility: Secondary | ICD-10-CM | POA: Diagnosis not present

## 2022-07-04 DIAGNOSIS — R2689 Other abnormalities of gait and mobility: Secondary | ICD-10-CM | POA: Diagnosis not present

## 2022-07-04 DIAGNOSIS — M6281 Muscle weakness (generalized): Secondary | ICD-10-CM | POA: Diagnosis not present

## 2022-07-07 DIAGNOSIS — Z23 Encounter for immunization: Secondary | ICD-10-CM | POA: Diagnosis not present

## 2022-07-25 DIAGNOSIS — L821 Other seborrheic keratosis: Secondary | ICD-10-CM | POA: Diagnosis not present

## 2022-07-25 DIAGNOSIS — L649 Androgenic alopecia, unspecified: Secondary | ICD-10-CM | POA: Diagnosis not present

## 2022-08-08 DIAGNOSIS — D1801 Hemangioma of skin and subcutaneous tissue: Secondary | ICD-10-CM | POA: Diagnosis not present

## 2022-08-08 DIAGNOSIS — L821 Other seborrheic keratosis: Secondary | ICD-10-CM | POA: Diagnosis not present

## 2022-10-04 DIAGNOSIS — H6123 Impacted cerumen, bilateral: Secondary | ICD-10-CM | POA: Diagnosis not present

## 2022-10-19 DIAGNOSIS — N390 Urinary tract infection, site not specified: Secondary | ICD-10-CM | POA: Diagnosis not present

## 2022-11-02 DIAGNOSIS — Z780 Asymptomatic menopausal state: Secondary | ICD-10-CM | POA: Diagnosis not present

## 2022-11-02 DIAGNOSIS — Z6831 Body mass index (BMI) 31.0-31.9, adult: Secondary | ICD-10-CM | POA: Diagnosis not present

## 2022-11-02 DIAGNOSIS — Z0000 Encounter for general adult medical examination without abnormal findings: Secondary | ICD-10-CM | POA: Diagnosis not present

## 2022-11-02 DIAGNOSIS — K219 Gastro-esophageal reflux disease without esophagitis: Secondary | ICD-10-CM | POA: Diagnosis not present

## 2022-11-02 DIAGNOSIS — I10 Essential (primary) hypertension: Secondary | ICD-10-CM | POA: Diagnosis not present

## 2022-11-02 DIAGNOSIS — E7800 Pure hypercholesterolemia, unspecified: Secondary | ICD-10-CM | POA: Diagnosis not present

## 2022-11-02 DIAGNOSIS — Z1331 Encounter for screening for depression: Secondary | ICD-10-CM | POA: Diagnosis not present

## 2022-11-02 DIAGNOSIS — E1169 Type 2 diabetes mellitus with other specified complication: Secondary | ICD-10-CM | POA: Diagnosis not present

## 2022-11-02 DIAGNOSIS — E6609 Other obesity due to excess calories: Secondary | ICD-10-CM | POA: Diagnosis not present

## 2022-11-02 DIAGNOSIS — N3281 Overactive bladder: Secondary | ICD-10-CM | POA: Diagnosis not present

## 2022-11-02 DIAGNOSIS — Z23 Encounter for immunization: Secondary | ICD-10-CM | POA: Diagnosis not present

## 2022-11-06 ENCOUNTER — Other Ambulatory Visit: Payer: Self-pay | Admitting: Internal Medicine

## 2022-11-06 DIAGNOSIS — Z780 Asymptomatic menopausal state: Secondary | ICD-10-CM

## 2022-11-20 DIAGNOSIS — H2513 Age-related nuclear cataract, bilateral: Secondary | ICD-10-CM | POA: Diagnosis not present

## 2022-11-20 DIAGNOSIS — E113293 Type 2 diabetes mellitus with mild nonproliferative diabetic retinopathy without macular edema, bilateral: Secondary | ICD-10-CM | POA: Diagnosis not present

## 2022-11-20 DIAGNOSIS — H5203 Hypermetropia, bilateral: Secondary | ICD-10-CM | POA: Diagnosis not present

## 2022-12-28 DIAGNOSIS — Z86010 Personal history of colonic polyps: Secondary | ICD-10-CM | POA: Diagnosis not present

## 2022-12-28 DIAGNOSIS — K6289 Other specified diseases of anus and rectum: Secondary | ICD-10-CM | POA: Diagnosis not present

## 2022-12-28 DIAGNOSIS — Z8719 Personal history of other diseases of the digestive system: Secondary | ICD-10-CM | POA: Diagnosis not present

## 2022-12-28 DIAGNOSIS — R198 Other specified symptoms and signs involving the digestive system and abdomen: Secondary | ICD-10-CM | POA: Diagnosis not present

## 2022-12-28 DIAGNOSIS — R195 Other fecal abnormalities: Secondary | ICD-10-CM | POA: Diagnosis not present

## 2023-01-03 ENCOUNTER — Other Ambulatory Visit: Payer: Self-pay | Admitting: Obstetrics and Gynecology

## 2023-01-03 DIAGNOSIS — Z1231 Encounter for screening mammogram for malignant neoplasm of breast: Secondary | ICD-10-CM

## 2023-01-18 DIAGNOSIS — D125 Benign neoplasm of sigmoid colon: Secondary | ICD-10-CM | POA: Diagnosis not present

## 2023-01-18 DIAGNOSIS — K5730 Diverticulosis of large intestine without perforation or abscess without bleeding: Secondary | ICD-10-CM | POA: Diagnosis not present

## 2023-01-18 DIAGNOSIS — D123 Benign neoplasm of transverse colon: Secondary | ICD-10-CM | POA: Diagnosis not present

## 2023-01-18 DIAGNOSIS — Z86010 Personal history of colonic polyps: Secondary | ICD-10-CM | POA: Diagnosis not present

## 2023-01-18 DIAGNOSIS — Z09 Encounter for follow-up examination after completed treatment for conditions other than malignant neoplasm: Secondary | ICD-10-CM | POA: Diagnosis not present

## 2023-01-18 DIAGNOSIS — K648 Other hemorrhoids: Secondary | ICD-10-CM | POA: Diagnosis not present

## 2023-01-22 DIAGNOSIS — D123 Benign neoplasm of transverse colon: Secondary | ICD-10-CM | POA: Diagnosis not present

## 2023-01-22 DIAGNOSIS — D125 Benign neoplasm of sigmoid colon: Secondary | ICD-10-CM | POA: Diagnosis not present

## 2023-01-23 DIAGNOSIS — L649 Androgenic alopecia, unspecified: Secondary | ICD-10-CM | POA: Diagnosis not present

## 2023-01-23 DIAGNOSIS — L821 Other seborrheic keratosis: Secondary | ICD-10-CM | POA: Diagnosis not present

## 2023-02-22 ENCOUNTER — Ambulatory Visit
Admission: RE | Admit: 2023-02-22 | Discharge: 2023-02-22 | Disposition: A | Discharge status: Home / Self Care | Payer: Medicare Other | Source: Ambulatory Visit | Attending: Obstetrics and Gynecology | Admitting: Obstetrics and Gynecology

## 2023-02-22 ENCOUNTER — Other Ambulatory Visit: Payer: Self-pay | Admitting: Obstetrics and Gynecology

## 2023-02-22 DIAGNOSIS — Z1231 Encounter for screening mammogram for malignant neoplasm of breast: Secondary | ICD-10-CM

## 2023-03-04 DIAGNOSIS — Z6831 Body mass index (BMI) 31.0-31.9, adult: Secondary | ICD-10-CM | POA: Diagnosis not present

## 2023-03-04 DIAGNOSIS — E6609 Other obesity due to excess calories: Secondary | ICD-10-CM | POA: Diagnosis not present

## 2023-03-04 DIAGNOSIS — E7800 Pure hypercholesterolemia, unspecified: Secondary | ICD-10-CM | POA: Diagnosis not present

## 2023-03-04 DIAGNOSIS — E1169 Type 2 diabetes mellitus with other specified complication: Secondary | ICD-10-CM | POA: Diagnosis not present

## 2023-03-04 DIAGNOSIS — I10 Essential (primary) hypertension: Secondary | ICD-10-CM | POA: Diagnosis not present

## 2023-04-10 DIAGNOSIS — L821 Other seborrheic keratosis: Secondary | ICD-10-CM | POA: Diagnosis not present

## 2023-04-10 DIAGNOSIS — L738 Other specified follicular disorders: Secondary | ICD-10-CM | POA: Diagnosis not present

## 2023-04-10 DIAGNOSIS — L853 Xerosis cutis: Secondary | ICD-10-CM | POA: Diagnosis not present

## 2023-04-10 DIAGNOSIS — L7211 Pilar cyst: Secondary | ICD-10-CM | POA: Diagnosis not present

## 2023-04-10 DIAGNOSIS — D1801 Hemangioma of skin and subcutaneous tissue: Secondary | ICD-10-CM | POA: Diagnosis not present

## 2023-04-10 DIAGNOSIS — D220 Melanocytic nevi of lip: Secondary | ICD-10-CM | POA: Diagnosis not present

## 2023-04-11 ENCOUNTER — Ambulatory Visit
Admission: RE | Admit: 2023-04-11 | Discharge: 2023-04-11 | Disposition: A | Discharge status: Home / Self Care | Payer: Medicare Other | Source: Ambulatory Visit | Attending: Internal Medicine | Admitting: Internal Medicine

## 2023-04-11 DIAGNOSIS — Z780 Asymptomatic menopausal state: Secondary | ICD-10-CM

## 2023-04-11 DIAGNOSIS — R29890 Loss of height: Secondary | ICD-10-CM | POA: Diagnosis not present

## 2023-04-11 DIAGNOSIS — E349 Endocrine disorder, unspecified: Secondary | ICD-10-CM | POA: Diagnosis not present

## 2023-04-11 DIAGNOSIS — N958 Other specified menopausal and perimenopausal disorders: Secondary | ICD-10-CM | POA: Diagnosis not present

## 2023-04-11 DIAGNOSIS — Z90722 Acquired absence of ovaries, bilateral: Secondary | ICD-10-CM | POA: Diagnosis not present

## 2023-07-03 DIAGNOSIS — Z23 Encounter for immunization: Secondary | ICD-10-CM | POA: Diagnosis not present

## 2023-07-03 DIAGNOSIS — I10 Essential (primary) hypertension: Secondary | ICD-10-CM | POA: Diagnosis not present

## 2023-07-03 DIAGNOSIS — E1169 Type 2 diabetes mellitus with other specified complication: Secondary | ICD-10-CM | POA: Diagnosis not present

## 2023-08-08 DIAGNOSIS — L821 Other seborrheic keratosis: Secondary | ICD-10-CM | POA: Diagnosis not present

## 2023-10-01 DIAGNOSIS — L659 Nonscarring hair loss, unspecified: Secondary | ICD-10-CM | POA: Diagnosis not present

## 2023-10-25 DIAGNOSIS — H6123 Impacted cerumen, bilateral: Secondary | ICD-10-CM | POA: Diagnosis not present

## 2023-10-30 DIAGNOSIS — L650 Telogen effluvium: Secondary | ICD-10-CM | POA: Diagnosis not present

## 2023-10-30 DIAGNOSIS — L649 Androgenic alopecia, unspecified: Secondary | ICD-10-CM | POA: Diagnosis not present

## 2023-11-06 DIAGNOSIS — E1169 Type 2 diabetes mellitus with other specified complication: Secondary | ICD-10-CM | POA: Diagnosis not present

## 2023-11-06 DIAGNOSIS — M8589 Other specified disorders of bone density and structure, multiple sites: Secondary | ICD-10-CM | POA: Diagnosis not present

## 2023-11-06 DIAGNOSIS — Z79899 Other long term (current) drug therapy: Secondary | ICD-10-CM | POA: Diagnosis not present

## 2023-11-06 DIAGNOSIS — Z0000 Encounter for general adult medical examination without abnormal findings: Secondary | ICD-10-CM | POA: Diagnosis not present

## 2023-11-06 DIAGNOSIS — L659 Nonscarring hair loss, unspecified: Secondary | ICD-10-CM | POA: Diagnosis not present

## 2023-11-06 DIAGNOSIS — K219 Gastro-esophageal reflux disease without esophagitis: Secondary | ICD-10-CM | POA: Diagnosis not present

## 2023-11-06 DIAGNOSIS — N3281 Overactive bladder: Secondary | ICD-10-CM | POA: Diagnosis not present

## 2023-11-06 DIAGNOSIS — I10 Essential (primary) hypertension: Secondary | ICD-10-CM | POA: Diagnosis not present

## 2023-11-06 DIAGNOSIS — E7800 Pure hypercholesterolemia, unspecified: Secondary | ICD-10-CM | POA: Diagnosis not present

## 2023-11-06 DIAGNOSIS — Z1331 Encounter for screening for depression: Secondary | ICD-10-CM | POA: Diagnosis not present

## 2023-11-27 DIAGNOSIS — H5203 Hypermetropia, bilateral: Secondary | ICD-10-CM | POA: Diagnosis not present

## 2023-11-27 DIAGNOSIS — E113291 Type 2 diabetes mellitus with mild nonproliferative diabetic retinopathy without macular edema, right eye: Secondary | ICD-10-CM | POA: Diagnosis not present

## 2023-11-27 DIAGNOSIS — H2513 Age-related nuclear cataract, bilateral: Secondary | ICD-10-CM | POA: Diagnosis not present

## 2024-01-21 ENCOUNTER — Other Ambulatory Visit: Payer: Self-pay | Admitting: Internal Medicine

## 2024-01-21 DIAGNOSIS — Z0000 Encounter for general adult medical examination without abnormal findings: Secondary | ICD-10-CM

## 2024-02-21 DIAGNOSIS — Z01419 Encounter for gynecological examination (general) (routine) without abnormal findings: Secondary | ICD-10-CM | POA: Diagnosis not present

## 2024-02-24 ENCOUNTER — Ambulatory Visit
Admission: RE | Admit: 2024-02-24 | Discharge: 2024-02-24 | Disposition: A | Discharge status: Home / Self Care | Source: Ambulatory Visit | Attending: Internal Medicine | Admitting: Internal Medicine

## 2024-02-24 DIAGNOSIS — Z0000 Encounter for general adult medical examination without abnormal findings: Secondary | ICD-10-CM

## 2024-03-12 DIAGNOSIS — K602 Anal fissure, unspecified: Secondary | ICD-10-CM | POA: Diagnosis not present

## 2024-05-07 DIAGNOSIS — E1169 Type 2 diabetes mellitus with other specified complication: Secondary | ICD-10-CM | POA: Diagnosis not present

## 2024-05-07 DIAGNOSIS — K219 Gastro-esophageal reflux disease without esophagitis: Secondary | ICD-10-CM | POA: Diagnosis not present

## 2024-05-07 DIAGNOSIS — I10 Essential (primary) hypertension: Secondary | ICD-10-CM | POA: Diagnosis not present

## 2024-05-14 DIAGNOSIS — D1801 Hemangioma of skin and subcutaneous tissue: Secondary | ICD-10-CM | POA: Diagnosis not present

## 2024-05-14 DIAGNOSIS — C44519 Basal cell carcinoma of skin of other part of trunk: Secondary | ICD-10-CM | POA: Diagnosis not present

## 2024-05-14 DIAGNOSIS — L2089 Other atopic dermatitis: Secondary | ICD-10-CM | POA: Diagnosis not present

## 2024-05-14 DIAGNOSIS — L649 Androgenic alopecia, unspecified: Secondary | ICD-10-CM | POA: Diagnosis not present

## 2024-05-14 DIAGNOSIS — D220 Melanocytic nevi of lip: Secondary | ICD-10-CM | POA: Diagnosis not present

## 2024-05-14 DIAGNOSIS — L720 Epidermal cyst: Secondary | ICD-10-CM | POA: Diagnosis not present

## 2024-05-14 DIAGNOSIS — L821 Other seborrheic keratosis: Secondary | ICD-10-CM | POA: Diagnosis not present

## 2024-05-14 DIAGNOSIS — D485 Neoplasm of uncertain behavior of skin: Secondary | ICD-10-CM | POA: Diagnosis not present

## 2024-05-27 DIAGNOSIS — C44519 Basal cell carcinoma of skin of other part of trunk: Secondary | ICD-10-CM | POA: Diagnosis not present

## 2024-06-08 DIAGNOSIS — Z23 Encounter for immunization: Secondary | ICD-10-CM | POA: Diagnosis not present

## 2024-08-05 DIAGNOSIS — I10 Essential (primary) hypertension: Secondary | ICD-10-CM | POA: Diagnosis not present

## 2024-08-05 DIAGNOSIS — E1169 Type 2 diabetes mellitus with other specified complication: Secondary | ICD-10-CM | POA: Diagnosis not present

## 2024-08-17 DIAGNOSIS — Z85828 Personal history of other malignant neoplasm of skin: Secondary | ICD-10-CM | POA: Diagnosis not present

## 2024-08-17 DIAGNOSIS — L649 Androgenic alopecia, unspecified: Secondary | ICD-10-CM | POA: Diagnosis not present

## 2024-08-17 DIAGNOSIS — L821 Other seborrheic keratosis: Secondary | ICD-10-CM | POA: Diagnosis not present

## 2024-08-17 DIAGNOSIS — L905 Scar conditions and fibrosis of skin: Secondary | ICD-10-CM | POA: Diagnosis not present
